# Patient Record
Sex: Female | Born: 1990
Health system: Southern US, Community
[De-identification: ages and names within clinical notes are randomized; demographics above are authoritative.]

## PROBLEM LIST (undated history)

## (undated) DIAGNOSIS — R87629 Unspecified abnormal cytological findings in specimens from vagina: Secondary | ICD-10-CM

## (undated) HISTORY — DX: Unspecified abnormal cytological findings in specimens from vagina: R87.629

---

## 2012-04-29 ENCOUNTER — Encounter (HOSPITAL_BASED_OUTPATIENT_CLINIC_OR_DEPARTMENT_OTHER): Payer: Self-pay | Admitting: Family Medicine

## 2012-04-29 ENCOUNTER — Emergency Department (HOSPITAL_BASED_OUTPATIENT_CLINIC_OR_DEPARTMENT_OTHER)
Admission: EM | Admit: 2012-04-29 | Discharge: 2012-04-29 | Disposition: A | Discharge status: Home / Self Care | Payer: Self-pay | Attending: Emergency Medicine | Admitting: Emergency Medicine

## 2012-04-29 DIAGNOSIS — R51 Headache: Secondary | ICD-10-CM | POA: Insufficient documentation

## 2012-04-29 DIAGNOSIS — F17200 Nicotine dependence, unspecified, uncomplicated: Secondary | ICD-10-CM | POA: Insufficient documentation

## 2012-04-29 DIAGNOSIS — N390 Urinary tract infection, site not specified: Secondary | ICD-10-CM | POA: Insufficient documentation

## 2012-04-29 LAB — BASIC METABOLIC PANEL
Calcium: 9.5 mg/dL (ref 8.4–10.5)
GFR calc Af Amer: >90 mL/min (ref >90)
GFR calc non Af Amer: >90 mL/min (ref >90)
Glucose, Bld: 84 mg/dL (ref 70–99)
Sodium: 141 mEq/L (ref 135–145)

## 2012-04-29 LAB — CBC WITH DIFFERENTIAL/PLATELET
Basophils Absolute: 0 10*3/uL (ref 0.0–0.1)
Basophils Relative: 0 % (ref 0–1)
Eosinophils Absolute: 0.2 10*3/uL (ref 0.0–0.7)
Eosinophils Relative: 2 % (ref 0–5)
MCH: 26.3 pg (ref 26.0–34.0)
MCHC: 33.9 g/dL (ref 30.0–36.0)
MCV: 77.6 fL — ABNORMAL LOW (ref 78.0–100.0)
Neutrophils Relative %: 37 % — ABNORMAL LOW (ref 43–77)
Platelets: 204 10*3/uL (ref 150–400)
RDW: 14.8 % (ref 11.5–15.5)

## 2012-04-29 LAB — URINALYSIS, ROUTINE W REFLEX MICROSCOPIC
Nitrite: NEGATIVE
Protein, ur: NEGATIVE mg/dL
Urobilinogen, UA: 1 mg/dL (ref 0.0–1.0)

## 2012-04-29 LAB — PREGNANCY, URINE: Preg Test, Ur: NEGATIVE

## 2012-04-29 LAB — URINE MICROSCOPIC-ADD ON

## 2012-04-29 MED ORDER — KETOROLAC TROMETHAMINE 30 MG/ML IJ SOLN
30.0000 mg | Freq: Once | INTRAMUSCULAR | Status: AC
  Administered 2012-04-29: 30 mg via INTRAMUSCULAR
  Filled 2012-04-29: qty 1

## 2012-04-29 MED ORDER — CIPROFLOXACIN HCL 500 MG PO TABS: 500.0000 mg | ORAL_TABLET | Freq: Two times a day (BID) | ORAL | Status: DC

## 2012-04-29 NOTE — ED Provider Notes (Signed)
History  This chart was scribed for Rolan Bucco, MD by Ardeen Jourdain, ED Scribe. This patient was seen in room MH08/MH08 and the patient's care was started at 2042.  CSN: [id number]  Arrival date & time 04/29/12  1843   First MD Initiated Contact with Patient 04/29/12 2042      Chief Complaint  Patient presents with  . Headache     The history is provided by the patient. No language interpreter was used.    Maria Davis is a 21 y.o. female who presents to the Emergency Department complaining of a gradually worsening headache that started 1 day ago with associated fatigue. She states the pain is intermittent and located in the frontal region. She denies any history of similar symptoms. She also denies fever, nausea, emesis, weight loss, neck pain, numbness and urinary problems. She states she is under a great deal of stress which could be a cause. She rates the pain at a 7 out of 10. She does not have a h/o of any pertinent or chronic medical conditions. She is a current everyday smoker but denies alcohol use.  Denies any recent head trauma or other headaches.    History reviewed. No pertinent past medical history.  History reviewed. No pertinent past surgical history.  No family history on file.  History  Substance Use Topics  . Smoking status: Current Every Day Smoker  . Smokeless tobacco: Not on file  . Alcohol Use: No   No OB history available.   Review of Systems  Constitutional: Negative for fever, chills, diaphoresis and fatigue.  HENT: Negative for congestion, rhinorrhea and sneezing.   Eyes: Negative.   Respiratory: Negative for cough, chest tightness and shortness of breath.   Cardiovascular: Negative for chest pain and leg swelling.  Gastrointestinal: Negative for nausea, vomiting, abdominal pain, diarrhea and blood in stool.  Genitourinary: Negative for frequency, hematuria, flank pain and difficulty urinating.  Musculoskeletal: Negative for back pain and  arthralgias.  Skin: Negative for rash.  Neurological: Negative for dizziness, speech difficulty, weakness, numbness and headaches.  All other systems reviewed and are negative.    Allergies  Review of patient's allergies indicates no known allergies.  Home Medications   Current Outpatient Rx  Name  Route  Sig  Dispense  Refill  . CIPROFLOXACIN HCL 500 MG PO TABS   Oral   Take 1 tablet (500 mg total) by mouth every 12 (twelve) hours.   14 tablet   0     Triage Vitals: BP 117/62  Pulse 84  Temp 98.9 F (37.2 C) (Oral)  Resp 16  Ht 5\' 3"  (1.6 m)  Wt 142 lb 7 oz (64.609 kg)  BMI 25.23 kg/m2  SpO2 100%  LMP 04/16/2012  Physical Exam  Nursing note and vitals reviewed. Constitutional: She is oriented to person, place, and time. She appears well-developed and well-nourished.  HENT:  Head: Normocephalic and atraumatic.  Eyes: Pupils are equal, round, and reactive to light.  Neck: Normal range of motion. Neck supple.  Cardiovascular: Normal rate, regular rhythm and normal heart sounds.   Pulmonary/Chest: Effort normal and breath sounds normal. No respiratory distress. She has no wheezes. She has no rales. She exhibits no tenderness.  Abdominal: Soft. Bowel sounds are normal. There is no tenderness. There is no rebound and no guarding.  Musculoskeletal: Normal range of motion. She exhibits no edema.  Lymphadenopathy:    She has no cervical adenopathy.  Neurological: She is alert and oriented to person, place,  and time. She displays normal reflexes. No cranial nerve deficit. She exhibits normal muscle tone. Coordination normal.       No photophobia, no meningeal signs, finger-nose intact  Skin: Skin is warm and dry. No rash noted.  Psychiatric: She has a normal mood and affect.    ED Course  Procedures (including critical care time)  DIAGNOSTIC STUDIES: Oxygen Saturation is 100% on room air, normal by my interpretation.    COORDINATION OF CARE:  8:59 PM: Discussed  treatment plan which includes bloodwork and pain medication with pt at bedside and pt agreed to plan.   Results for orders placed during the hospital encounter of 04/29/12  CBC WITH DIFFERENTIAL      Component Value Range   WBC 6.6  4.0 - 10.5 K/uL   RBC 4.56  3.87 - 5.11 MIL/uL   Hemoglobin 12.0  12.0 - 15.0 g/dL   HCT 16.1 (*) 09.6 - 04.5 %   MCV 77.6 (*) 78.0 - 100.0 fL   MCH 26.3  26.0 - 34.0 pg   MCHC 33.9  30.0 - 36.0 g/dL   RDW 40.9  81.1 - 91.4 %   Platelets 204  150 - 400 K/uL   Neutrophils Relative 37 (*) 43 - 77 %   Neutro Abs 2.4  1.7 - 7.7 K/uL   Lymphocytes Relative 54 (*) 12 - 46 %   Lymphs Abs 3.5  0.7 - 4.0 K/uL   Monocytes Relative 7  3 - 12 %   Monocytes Absolute 0.5  0.1 - 1.0 K/uL   Eosinophils Relative 2  0 - 5 %   Eosinophils Absolute 0.2  0.0 - 0.7 K/uL   Basophils Relative 0  0 - 1 %   Basophils Absolute 0.0  0.0 - 0.1 K/uL  BASIC METABOLIC PANEL      Component Value Range   Sodium 141  135 - 145 mEq/L   Potassium 3.8  3.5 - 5.1 mEq/L   Chloride 106  96 - 112 mEq/L   CO2 27  19 - 32 mEq/L   Glucose, Bld 84  70 - 99 mg/dL   BUN 11  6 - 23 mg/dL   Creatinine, Ser 7.82  0.50 - 1.10 mg/dL   Calcium 9.5  8.4 - 95.6 mg/dL   GFR calc non Af Amer >90  >90 mL/min   GFR calc Af Amer >90  >90 mL/min  URINALYSIS, ROUTINE W REFLEX MICROSCOPIC      Component Value Range   Color, Urine YELLOW  YELLOW   APPearance CLOUDY (*) CLEAR   Specific Gravity, Urine 1.027  1.005 - 1.030   pH 6.0  5.0 - 8.0   Glucose, UA NEGATIVE  NEGATIVE mg/dL   Hgb urine dipstick TRACE (*) NEGATIVE   Bilirubin Urine NEGATIVE  NEGATIVE   Ketones, ur NEGATIVE  NEGATIVE mg/dL   Protein, ur NEGATIVE  NEGATIVE mg/dL   Urobilinogen, UA 1.0  0.0 - 1.0 mg/dL   Nitrite NEGATIVE  NEGATIVE   Leukocytes, UA MODERATE (*) NEGATIVE  PREGNANCY, URINE      Component Value Range   Preg Test, Ur NEGATIVE  NEGATIVE  URINE MICROSCOPIC-ADD ON      Component Value Range   Squamous Epithelial / LPF  RARE  RARE   WBC, UA 7-10  <3 WBC/hpf   RBC / HPF 0-2  <3 RBC/hpf   Bacteria, UA MANY (*) RARE   Urine-Other MUCOUS PRESENT     No results found.  1. Headache   2. UTI (lower urinary tract infection)       MDM  Pt well appearing, smiling, texting on her phone on exam.  Intermittent headache since yesterday.  Nothing to suggest meningitis, SAH, mass.  Could be related to UTI.  Will tx with abx, advised to f/u with PMD or return her if headache worsens or is persistent      I personally performed the services described in this documentation, which was scribed in my presence.  The recorded information has been reviewed and considered.    Rolan Bucco, MD 04/30/12 0005

## 2012-04-29 NOTE — ED Notes (Signed)
Pt c/o headache x 1 day and feeling tired.

## 2012-05-03 LAB — URINE CULTURE

## 2012-05-04 NOTE — ED Notes (Signed)
+  Urine. Patient treated with Cipro. Sensitive to same. Per protocol MD. °

## 2013-06-04 ENCOUNTER — Emergency Department (HOSPITAL_BASED_OUTPATIENT_CLINIC_OR_DEPARTMENT_OTHER)
Admission: EM | Admit: 2013-06-04 | Discharge: 2013-06-04 | Disposition: A | Discharge status: Home / Self Care | Payer: Medicaid Other | Attending: Emergency Medicine | Admitting: Emergency Medicine

## 2013-06-04 ENCOUNTER — Encounter (HOSPITAL_BASED_OUTPATIENT_CLINIC_OR_DEPARTMENT_OTHER): Payer: Self-pay | Admitting: Emergency Medicine

## 2013-06-04 DIAGNOSIS — T7840XA Allergy, unspecified, initial encounter: Secondary | ICD-10-CM

## 2013-06-04 DIAGNOSIS — R1310 Dysphagia, unspecified: Secondary | ICD-10-CM | POA: Insufficient documentation

## 2013-06-04 DIAGNOSIS — Y929 Unspecified place or not applicable: Secondary | ICD-10-CM | POA: Insufficient documentation

## 2013-06-04 DIAGNOSIS — T628X1A Toxic effect of other specified noxious substances eaten as food, accidental (unintentional), initial encounter: Secondary | ICD-10-CM | POA: Insufficient documentation

## 2013-06-04 DIAGNOSIS — Y9389 Activity, other specified: Secondary | ICD-10-CM | POA: Insufficient documentation

## 2013-06-04 DIAGNOSIS — F17200 Nicotine dependence, unspecified, uncomplicated: Secondary | ICD-10-CM | POA: Insufficient documentation

## 2013-06-04 MED ORDER — FAMOTIDINE 20 MG PO TABS
20.0000 mg | ORAL_TABLET | Freq: Once | ORAL | Status: AC
  Administered 2013-06-04: 20 mg via ORAL
  Filled 2013-06-04: qty 1

## 2013-06-04 MED ORDER — EPINEPHRINE 0.3 MG/0.3ML IJ SOAJ: 0.3000 mg | INTRAMUSCULAR | Status: DC | PRN

## 2013-06-04 MED ORDER — DIPHENHYDRAMINE HCL 25 MG PO CAPS
25.0000 mg | ORAL_CAPSULE | Freq: Once | ORAL | Status: AC
  Administered 2013-06-04: 25 mg via ORAL
  Filled 2013-06-04: qty 1

## 2013-06-04 MED ORDER — FAMOTIDINE 20 MG PO TABS: 20.0000 mg | ORAL_TABLET | Freq: Two times a day (BID) | ORAL | Status: DC

## 2013-06-04 MED ORDER — DIPHENHYDRAMINE HCL 25 MG PO CAPS: 25.0000 mg | ORAL_CAPSULE | Freq: Four times a day (QID) | ORAL | Status: DC | PRN

## 2013-06-04 NOTE — ED Notes (Signed)
Pt placed on continuous pulse ox, PO 100% on room air. No resp diff noted, no vomiting at this time.

## 2013-06-04 NOTE — ED Provider Notes (Signed)
CSN: [id number]     Arrival date & time 06/04/13  1947 History   First MD Initiated Contact with Patient 06/04/13 2013     Chief Complaint  Patient presents with  . Allergic Reaction   (Consider location/radiation/quality/duration/timing/severity/associated sxs/prior Treatment) HPI Comments: She took one bite of a hamburger with tomato approximately one hour prior to arrival and had immediate symptoms of throat fullness and difficulty swallowing. No lip or tongue swelling. No rash or shortness of breath. No pain. She reports similar symptoms as a child. Currently, symptoms have improved with only mild residual dysphagia.  Patient is a 22 y.o. female presenting with allergic reaction. The history is provided by the patient. No language interpreter was used.  Allergic Reaction Presenting symptoms: difficulty swallowing   Presenting symptoms: no difficulty breathing and no rash     History reviewed. No pertinent past medical history. History reviewed. No pertinent past surgical history. No family history on file. History  Substance Use Topics  . Smoking status: Current Every Day Smoker    Types: Cigarettes  . Smokeless tobacco: Never Used  . Alcohol Use: Yes     Comment: occasional   OB History   Grav Para Term Preterm Abortions TAB SAB Ect Mult Living                 Review of Systems  Constitutional: Negative for fever.  HENT: Positive for trouble swallowing. Negative for facial swelling and sore throat.   Respiratory: Negative for shortness of breath.   Cardiovascular: Negative for chest pain.  Gastrointestinal: Negative for abdominal pain.  Skin: Negative for rash.    Allergies  Tomato  Home Medications   Current Outpatient Rx  Name  Route  Sig  Dispense  Refill  . ciprofloxacin (CIPRO) 500 MG tablet   Oral   Take 1 tablet (500 mg total) by mouth every 12 (twelve) hours.   14 tablet   0    BP 129/72  Pulse 86  Temp(Src) 98.1 F (36.7 C) (Oral)  Resp 20   Ht 5\' 3"  (1.6 m)  Wt 142 lb (64.411 kg)  BMI 25.16 kg/m2  SpO2 100% Physical Exam  Constitutional: She is oriented to person, place, and time. She appears well-developed and well-nourished.  HENT:  Head: Normocephalic.  Mouth/Throat: Oropharynx is clear and moist.  Neck: Normal range of motion. Neck supple.  Cardiovascular: Normal rate and regular rhythm.   No murmur heard. Pulmonary/Chest: Effort normal and breath sounds normal. No stridor. She has no wheezes. She has no rales.  Abdominal: Soft. Bowel sounds are normal. There is no tenderness. There is no rebound and no guarding.  Musculoskeletal: Normal range of motion.  Lymphadenopathy:    She has no cervical adenopathy.  Neurological: She is alert and oriented to person, place, and time.  Skin: Skin is warm and dry. No rash noted.  Psychiatric: She has a normal mood and affect.    ED Course  Procedures (including critical care time) Labs Review Labs Reviewed - No data to display Imaging Review No results found.  EKG Interpretation   None       MDM  No diagnosis found. 1. Acute allergic reaction.  Reaction mild and exam normal in ED. Treated with Benadryl and Pepcid without further development of symptoms. VSS. Stable for discharge.     Arnoldo Hooker, PA-C 06/04/13 2129

## 2013-06-04 NOTE — ED Provider Notes (Signed)
Medical screening examination/treatment/procedure(s) were performed by non-physician practitioner and as supervising physician I was immediately available for consultation/collaboration.     Caitland Porchia, MD 06/04/13 2313 

## 2013-06-04 NOTE — ED Notes (Signed)
Pt reports she ate fast food about 30 mins pta- asked for no tomatoes because she states she has had trouble eating them in the past. Ate one bite of sandwich which had tomato on it. States her throat started itching immediately, c/o abd pain and vomited x 1 in triage

## 2014-05-04 ENCOUNTER — Encounter (HOSPITAL_BASED_OUTPATIENT_CLINIC_OR_DEPARTMENT_OTHER): Payer: Self-pay | Admitting: *Deleted

## 2014-05-04 ENCOUNTER — Emergency Department (HOSPITAL_BASED_OUTPATIENT_CLINIC_OR_DEPARTMENT_OTHER)
Admission: EM | Admit: 2014-05-04 | Discharge: 2014-05-04 | Disposition: A | Discharge status: Home / Self Care | Payer: Medicaid Other | Attending: Emergency Medicine | Admitting: Emergency Medicine

## 2014-05-04 DIAGNOSIS — Z79899 Other long term (current) drug therapy: Secondary | ICD-10-CM | POA: Insufficient documentation

## 2014-05-04 DIAGNOSIS — Z720 Tobacco use: Secondary | ICD-10-CM | POA: Insufficient documentation

## 2014-05-04 DIAGNOSIS — Z792 Long term (current) use of antibiotics: Secondary | ICD-10-CM | POA: Insufficient documentation

## 2014-05-04 DIAGNOSIS — K047 Periapical abscess without sinus: Secondary | ICD-10-CM | POA: Insufficient documentation

## 2014-05-04 MED ORDER — PENICILLIN V POTASSIUM 500 MG PO TABS: 500.0000 mg | ORAL_TABLET | Freq: Four times a day (QID) | ORAL | Status: AC

## 2014-05-04 NOTE — ED Provider Notes (Signed)
CSN: [id number]     Arrival date & time 05/04/14  1539 History  This chart was scribed for Toy CookeyMegan Docherty, MD by Charline BillsEssence Howell, ED Scribe. The patient was seen in room MH05/MH05. Patient's care was started at 3:49 PM.   Chief Complaint  Patient presents with  . Dental Pain   Patient is a 23 y.o. female presenting with tooth pain. The history is provided by the patient. No language interpreter was used.  Dental Pain Location:  Upper Quality:  Pressure-like Severity:  Moderate Onset quality:  Sudden Duration:  8 hours Progression:  Improving Chronicity:  New Relieved by:  NSAIDs Associated symptoms: facial swelling   Associated symptoms: no congestion, no fever, no headaches and no neck pain    HPI Comments: Maria Davis is a 23 y.o. female who presents to the Emergency Department complaining of gradually improving L sided facial swelling onset this morning upon waking. Pt reports associated L sided upper dental pain that she describes as pressure. Pain is exacerbated with chewing. She denies fever, chills, ear pain, sore throat, difficulty swallowing, difficulty opening her mouth. Pt tried ibuprofen this morning with mild relief. No h/o similar infection. No allergies. Pt has a Education officer, communitydentist.  History reviewed. No pertinent past medical history. History reviewed. No pertinent past surgical history. History reviewed. No pertinent family history. History  Substance Use Topics  . Smoking status: Current Every Day Smoker -- 0.50 packs/day    Types: Cigarettes  . Smokeless tobacco: Never Used  . Alcohol Use: Yes     Comment: occasional   OB History    No data available     Review of Systems  Constitutional: Negative for fever, chills, diaphoresis, activity change, appetite change and fatigue.  HENT: Positive for dental problem and facial swelling. Negative for congestion, ear pain, rhinorrhea, sore throat and trouble swallowing.   Eyes: Negative for photophobia and discharge.   Respiratory: Negative for cough, chest tightness and shortness of breath.   Cardiovascular: Negative for chest pain, palpitations and leg swelling.  Gastrointestinal: Negative for nausea, vomiting, abdominal pain and diarrhea.  Endocrine: Negative for polydipsia and polyuria.  Genitourinary: Negative for dysuria, frequency, difficulty urinating and pelvic pain.  Musculoskeletal: Negative for back pain, arthralgias, neck pain and neck stiffness.  Skin: Negative for color change and wound.  Allergic/Immunologic: Negative for immunocompromised state.  Neurological: Negative for facial asymmetry, weakness, numbness and headaches.  Hematological: Does not bruise/bleed easily.  Psychiatric/Behavioral: Negative for confusion and agitation.    Allergies  Tomato  Home Medications   Prior to Admission medications   Medication Sig Start Date End Date Taking? Authorizing Provider  ciprofloxacin (CIPRO) 500 MG tablet Take 1 tablet (500 mg total) by mouth every 12 (twelve) hours. 04/29/12   Rolan BuccoMelanie Belfi, MD  diphenhydrAMINE (BENADRYL) 25 mg capsule Take 1 capsule (25 mg total) by mouth every 6 (six) hours as needed. 06/04/13   Shari A Upstill, PA-C  EPINEPHrine (EPIPEN) 0.3 mg/0.3 mL SOAJ injection Inject 0.3 mLs (0.3 mg total) into the muscle as needed. Keep one in your purse, one in your home and one in your car and use in the event you suspect an allergic reaction. Then go to your nearest emergency room for further treatment. 06/04/13   Shari A Upstill, PA-C  famotidine (PEPCID) 20 MG tablet Take 1 tablet (20 mg total) by mouth 2 (two) times daily. 06/04/13   Shari A Upstill, PA-C  penicillin v potassium (VEETID) 500 MG tablet Take 1 tablet (500 mg total)  by mouth 4 (four) times daily. 05/04/14 05/11/14  Toy CookeyMegan Docherty, MD   Triage Vitals: BP 111/53 mmHg  Pulse 69  Temp(Src) 98.2 F (36.8 C) (Oral)  Resp 16  Ht 5\' 3"  (1.6 m)  Wt 152 lb (68.947 kg)  BMI 26.93 kg/m2  SpO2 100% Physical Exam   Constitutional: She is oriented to person, place, and time. She appears well-developed and well-nourished. No distress.  HENT:  Head: Normocephalic.  Mouth/Throat: Oropharynx is clear and moist.  Soft tissue swelling of L cheek and buccal mucosa around L upper posterior molars  No visible abscess  Eyes: Pupils are equal, round, and reactive to light.  Neck: Neck supple.  Cardiovascular: Normal rate, regular rhythm and normal heart sounds.   Pulmonary/Chest: Effort normal and breath sounds normal. No respiratory distress. She has no wheezes.  Abdominal: Soft. She exhibits no distension. There is no tenderness. There is no rebound and no guarding.  Musculoskeletal: She exhibits no edema or tenderness.  Neurological: She is alert and oriented to person, place, and time.  Skin: Skin is warm and dry.  Psychiatric: She has a normal mood and affect.  Nursing note and vitals reviewed.  ED Course  Procedures (including critical care time) DIAGNOSTIC STUDIES: Oxygen Saturation is 100% on RA, normal by my interpretation.    COORDINATION OF CARE: 3:54 PM-Discussed treatment plan which includes antibiotics and follow-up with dentist with pt at bedside and pt agreed to plan.   Labs Review Labs Reviewed - No data to display  Imaging Review No results found.   EKG Interpretation None      MDM   Final diagnoses:  Periapical abscess with facial involvement    Pt is a 23 y.o. female with Pmhx as above who presents with 1 days of facial swelling and mild dental pain of upper L posterior molar. She has soft tissue swelling as above w/o evidence of abscess, trismus or airway compromise. Suspect early periapical abscess w/ facial cellulitis. Will start on 10d pen VK, outpt dentistry follow up. Return precautions given for new or worsening symptoms including worsening swelling, fever, trouble swallowing.     I personally performed the services described in this documentation, which was  scribed in my presence. The recorded information has been reviewed and is accurate.    Toy CookeyMegan Docherty, MD 05/04/14 [phone]

## 2014-05-04 NOTE — ED Notes (Signed)
Pt c/o dental pain x 8 hrs

## 2014-05-04 NOTE — Discharge Instructions (Signed)

## 2014-06-10 ENCOUNTER — Encounter (HOSPITAL_BASED_OUTPATIENT_CLINIC_OR_DEPARTMENT_OTHER): Payer: Self-pay | Admitting: Emergency Medicine

## 2014-06-10 ENCOUNTER — Emergency Department (HOSPITAL_BASED_OUTPATIENT_CLINIC_OR_DEPARTMENT_OTHER)
Admission: EM | Admit: 2014-06-10 | Discharge: 2014-06-10 | Disposition: A | Discharge status: Home / Self Care | Payer: Medicaid Other | Attending: Emergency Medicine | Admitting: Emergency Medicine

## 2014-06-10 ENCOUNTER — Emergency Department (HOSPITAL_BASED_OUTPATIENT_CLINIC_OR_DEPARTMENT_OTHER): Payer: Medicaid Other

## 2014-06-10 DIAGNOSIS — Y990 Civilian activity done for income or pay: Secondary | ICD-10-CM | POA: Insufficient documentation

## 2014-06-10 DIAGNOSIS — S6392XA Sprain of unspecified part of left wrist and hand, initial encounter: Secondary | ICD-10-CM | POA: Insufficient documentation

## 2014-06-10 DIAGNOSIS — S63502A Unspecified sprain of left wrist, initial encounter: Secondary | ICD-10-CM

## 2014-06-10 DIAGNOSIS — R52 Pain, unspecified: Secondary | ICD-10-CM

## 2014-06-10 DIAGNOSIS — Z792 Long term (current) use of antibiotics: Secondary | ICD-10-CM | POA: Insufficient documentation

## 2014-06-10 DIAGNOSIS — Z720 Tobacco use: Secondary | ICD-10-CM | POA: Insufficient documentation

## 2014-06-10 DIAGNOSIS — Z79899 Other long term (current) drug therapy: Secondary | ICD-10-CM | POA: Insufficient documentation

## 2014-06-10 DIAGNOSIS — Y9289 Other specified places as the place of occurrence of the external cause: Secondary | ICD-10-CM | POA: Insufficient documentation

## 2014-06-10 DIAGNOSIS — X58XXXA Exposure to other specified factors, initial encounter: Secondary | ICD-10-CM | POA: Insufficient documentation

## 2014-06-10 DIAGNOSIS — Y9389 Activity, other specified: Secondary | ICD-10-CM | POA: Insufficient documentation

## 2014-06-10 NOTE — ED Provider Notes (Signed)
CSN: [id number]     Arrival date & time 06/10/14  0703 History   First MD Initiated Contact with Patient 06/10/14 0755     Chief Complaint  Patient presents with  . Arm Pain     (Consider location/radiation/quality/duration/timing/severity/associated sxs/prior Treatment) HPI Comments: Patient reports moving multiple boxes last night at work, and beginning to have a throbbing left wrist pain starting last night that persisted this morning after taking ibuprofen.  No numbness or weakness.  No fevers or chills  Patient is a 23 y.o. female presenting with arm pain. The history is provided by the patient.  Arm Pain This is a new problem. The current episode started yesterday. The problem occurs constantly. Pertinent negatives include no chest pain, no abdominal pain, no headaches and no shortness of breath. Exacerbated by: bending wrist. The symptoms are relieved by rest. She has tried nothing for the symptoms. The treatment provided no relief.    History reviewed. No pertinent past medical history. History reviewed. No pertinent past surgical history. History reviewed. No pertinent family history. History  Substance Use Topics  . Smoking status: Current Every Day Smoker -- 0.50 packs/day    Types: Cigarettes  . Smokeless tobacco: Never Used  . Alcohol Use: Yes     Comment: occasional   OB History    No data available     Review of Systems  Constitutional: Negative for fever, chills, diaphoresis, activity change, appetite change and fatigue.  HENT: Negative for congestion, facial swelling, rhinorrhea and sore throat.   Eyes: Negative for photophobia and discharge.  Respiratory: Negative for cough, chest tightness and shortness of breath.   Cardiovascular: Negative for chest pain, palpitations and leg swelling.  Gastrointestinal: Negative for nausea, vomiting, abdominal pain and diarrhea.  Endocrine: Negative for polydipsia and polyuria.  Genitourinary: Negative for dysuria,  frequency, difficulty urinating and pelvic pain.  Musculoskeletal: Negative for back pain, arthralgias, neck pain and neck stiffness.  Skin: Negative for color change and wound.  Allergic/Immunologic: Negative for immunocompromised state.  Neurological: Negative for facial asymmetry, weakness, numbness and headaches.  Hematological: Does not bruise/bleed easily.  Psychiatric/Behavioral: Negative for confusion and agitation.      Allergies  Tomato  Home Medications   Prior to Admission medications   Medication Sig Start Date End Date Taking? Authorizing Provider  ciprofloxacin (CIPRO) 500 MG tablet Take 1 tablet (500 mg total) by mouth every 12 (twelve) hours. 04/29/12   Rolan BuccoMelanie Belfi, MD  diphenhydrAMINE (BENADRYL) 25 mg capsule Take 1 capsule (25 mg total) by mouth every 6 (six) hours as needed. 06/04/13   Shari A Upstill, PA-C  EPINEPHrine (EPIPEN) 0.3 mg/0.3 mL SOAJ injection Inject 0.3 mLs (0.3 mg total) into the muscle as needed. Keep one in your purse, one in your home and one in your car and use in the event you suspect an allergic reaction. Then go to your nearest emergency room for further treatment. 06/04/13   Shari A Upstill, PA-C  famotidine (PEPCID) 20 MG tablet Take 1 tablet (20 mg total) by mouth 2 (two) times daily. 06/04/13   Shari A Upstill, PA-C   BP 104/55 mmHg  Pulse 86  Temp(Src) 98.4 F (36.9 C) (Oral)  Resp 16  Wt 150 lb (68.04 kg)  SpO2 100%  LMP 06/03/2014 Physical Exam  Constitutional: She is oriented to person, place, and time. She appears well-developed and well-nourished. No distress.  HENT:  Head: Normocephalic and atraumatic.  Mouth/Throat: No oropharyngeal exudate.  Eyes: Pupils are equal, round,  and reactive to light.  Neck: Normal range of motion. Neck supple.  Cardiovascular: Normal rate, regular rhythm and normal heart sounds.  Exam reveals no gallop and no friction rub.   No murmur heard. Pulmonary/Chest: Effort normal and breath sounds  normal. No respiratory distress. She has no wheezes. She has no rales.  Abdominal: Soft. Bowel sounds are normal. She exhibits no distension and no mass. There is no tenderness. There is no rebound and no guarding.  Musculoskeletal: Normal range of motion. She exhibits no edema.       Left wrist: She exhibits tenderness. She exhibits normal range of motion, no swelling, no effusion, no deformity and no laceration.       Arms: Neurological: She is alert and oriented to person, place, and time.  Skin: Skin is warm and dry.  Psychiatric: She has a normal mood and affect.    ED Course  Procedures (including critical care time) Labs Review Labs Reviewed - No data to display  Imaging Review Dg Wrist Complete Left  06/10/2014   CLINICAL DATA:  One day history of generalized wrist pain. No well-defined trauma history.  EXAM: LEFT WRIST - COMPLETE 3+ VIEW  COMPARISON:  None.  FINDINGS: Frontal, oblique, lateral, and ulnar deviation scaphoid images were obtained. There is no fracture or dislocation. Joint spaces appear intact. No erosive change.  IMPRESSION: No fracture or dislocation.  No appreciable arthropathic change.   Electronically Signed   By: Bretta BangWilliam  Woodruff M.D.   On: 06/10/2014 08:05     EKG Interpretation None      MDM   Final diagnoses:  Pain  Left wrist sprain, initial encounter    Pt is a 23 y.o. female with Pmhx as above who presents with left wrist pain after moving boxes at work last night.  She is neurovascularly intact distally.  X-ray negative.  Suspect wrist sprain.  We'll place a removable Velcro splint for comfort.  Recommend NSAIDs at home for pain and swelling.     Cindie CrumblyJacques Maslin evaluation in the Emergency Department is complete. It has been determined that no acute conditions requiring further emergency intervention are present at this time. The patient/guardian have been advised of the diagnosis and plan. We have discussed signs and symptoms that warrant  return to the ED, such as changes or worsening in symptoms, fevers, chills, numbness, weakness      Toy CookeyMegan Docherty, MD 06/11/14 2000

## 2014-06-10 NOTE — Discharge Instructions (Signed)
Wrist Pain °A wrist sprain happens when the bands of tissue that hold the wrist joints together (ligament) stretch too much or tear. A wrist strain happens when muscles or bands of tissue that connect muscles to bones (tendons) are stretched or pulled. °HOME CARE °· Put ice on the injured area. °¨ Put ice in a plastic bag. °¨ Place a towel between your skin and the bag. °¨ Leave the ice on for 15-20 minutes, 03-04 times a day, for the first 2 days. °· Raise (elevate) the injured wrist to lessen puffiness (swelling). °· Rest the injured wrist for at least 48 hours or as told by your doctor. °· Wear a splint, cast, or an elastic wrap as told by your doctor. °· Only take medicine as told by your doctor. °· Follow up with your doctor as told. This is important. °GET HELP RIGHT AWAY IF:  °· The fingers are puffy, very red, white, or cold and blue. °· The fingers lose feeling (numb) or tingle. °· The pain gets worse. °· It is hard to move the fingers. °MAKE SURE YOU:  °· Understand these instructions. °· Will watch your condition. °· Will get help right away if you are not doing well or get worse. °Document Released: 11/22/2007 Document Revised: 08/28/2011 Document Reviewed: 07/27/2010 °Exit[employer]® Patient Information ©2015 [employer]. This information is not intended to replace advice given to you by your health care provider. Make sure you discuss any questions you have with your health care provider. ° °

## 2014-06-10 NOTE — ED Notes (Signed)
Patient reports that she has left arm pain from her wrist to her elbow.

## 2014-08-06 ENCOUNTER — Encounter (HOSPITAL_BASED_OUTPATIENT_CLINIC_OR_DEPARTMENT_OTHER): Payer: Self-pay

## 2014-08-06 ENCOUNTER — Other Ambulatory Visit: Payer: Self-pay | Admitting: Emergency Medicine

## 2014-08-06 ENCOUNTER — Emergency Department (HOSPITAL_BASED_OUTPATIENT_CLINIC_OR_DEPARTMENT_OTHER)
Admission: EM | Admit: 2014-08-06 | Discharge: 2014-08-06 | Disposition: A | Discharge status: Home / Self Care | Payer: Medicaid Other | Attending: Emergency Medicine | Admitting: Emergency Medicine

## 2014-08-06 DIAGNOSIS — M791 Myalgia: Secondary | ICD-10-CM | POA: Insufficient documentation

## 2014-08-06 DIAGNOSIS — B349 Viral infection, unspecified: Secondary | ICD-10-CM | POA: Insufficient documentation

## 2014-08-06 DIAGNOSIS — Z79899 Other long term (current) drug therapy: Secondary | ICD-10-CM | POA: Insufficient documentation

## 2014-08-06 DIAGNOSIS — Z720 Tobacco use: Secondary | ICD-10-CM | POA: Insufficient documentation

## 2014-08-06 DIAGNOSIS — Z3202 Encounter for pregnancy test, result negative: Secondary | ICD-10-CM | POA: Insufficient documentation

## 2014-08-06 LAB — URINE MICROSCOPIC-ADD ON

## 2014-08-06 LAB — URINALYSIS, ROUTINE W REFLEX MICROSCOPIC
BILIRUBIN URINE: NEGATIVE
Glucose, UA: NEGATIVE mg/dL
KETONES UR: NEGATIVE mg/dL
Nitrite: NEGATIVE
Protein, ur: NEGATIVE mg/dL
Specific Gravity, Urine: 1.025 (ref 1.005–1.030)
Urobilinogen, UA: 1 mg/dL (ref 0.0–1.0)
pH: 7 (ref 5.0–8.0)

## 2014-08-06 LAB — BASIC METABOLIC PANEL
Anion gap: 2 — ABNORMAL LOW (ref 5–15)
BUN: 9 mg/dL (ref 6–23)
CO2: 25 mmol/L (ref 19–32)
Calcium: 8.6 mg/dL (ref 8.4–10.5)
Chloride: 109 mmol/L (ref 96–112)
Creatinine, Ser: 0.49 mg/dL — ABNORMAL LOW (ref 0.50–1.10)
Glucose, Bld: 85 mg/dL (ref 70–99)
POTASSIUM: 3.7 mmol/L (ref 3.5–5.1)
SODIUM: 136 mmol/L (ref 135–145)

## 2014-08-06 LAB — RAPID STREP SCREEN (MED CTR MEBANE ONLY): STREPTOCOCCUS, GROUP A SCREEN (DIRECT): NEGATIVE

## 2014-08-06 LAB — PREGNANCY, URINE: Preg Test, Ur: NEGATIVE

## 2014-08-06 MED ORDER — ONDANSETRON HCL 4 MG/2ML IJ SOLN
4.0000 mg | Freq: Once | INTRAMUSCULAR | Status: AC
  Administered 2014-08-06: 4 mg via INTRAVENOUS
  Filled 2014-08-06: qty 2

## 2014-08-06 MED ORDER — ONDANSETRON 4 MG PO TBDP: ORAL_TABLET | ORAL | Status: DC

## 2014-08-06 MED ORDER — SODIUM CHLORIDE 0.9 % IV BOLUS (SEPSIS)
1000.0000 mL | Freq: Once | INTRAVENOUS | Status: AC
  Administered 2014-08-06: 1000 mL via INTRAVENOUS

## 2014-08-06 NOTE — Discharge Instructions (Signed)

## 2014-08-06 NOTE — ED Notes (Signed)
Vomiting, fever and unable to keep PO fluids down x 2 days

## 2014-08-06 NOTE — ED Notes (Signed)
C/o feeling sick since Tuesday- chills, sore throat, body aches- n/v/d also

## 2014-08-06 NOTE — ED Provider Notes (Signed)
CSN: [id number]     Arrival date & time 08/06/14  4098 History   First MD Initiated Contact with Patient 08/06/14 1105     Chief Complaint  Patient presents with  . Emesis     (Consider location/radiation/quality/duration/timing/severity/associated sxs/prior Treatment) HPI Comments: Patient history of nausea vomiting diarrhea for 2 days. She's had some myalgias and sore throat as well. She denies any known fevers but has had some myalgias. She denies any runny nose or congestion. She hasn't had any vomiting since this morning. She denies any abdominal pain.she denies any urinary symptoms.  Patient is a 24 y.o. female presenting with vomiting.  Emesis Associated symptoms: diarrhea, myalgias and sore throat   Associated symptoms: no abdominal pain, no arthralgias, no chills and no headaches     History reviewed. No pertinent past medical history. History reviewed. No pertinent past surgical history. No family history on file. History  Substance Use Topics  . Smoking status: Current Every Day Smoker -- 0.50 packs/day    Types: Cigarettes  . Smokeless tobacco: Never Used  . Alcohol Use: Yes     Comment: occasional   OB History    No data available     Review of Systems  Constitutional: Positive for fatigue. Negative for fever, chills and diaphoresis.  HENT: Positive for sore throat. Negative for congestion, rhinorrhea and sneezing.   Eyes: Negative.   Respiratory: Negative for cough, chest tightness and shortness of breath.   Cardiovascular: Negative for chest pain and leg swelling.  Gastrointestinal: Positive for nausea, vomiting and diarrhea. Negative for abdominal pain and blood in stool.  Genitourinary: Negative for frequency, hematuria, flank pain and difficulty urinating.  Musculoskeletal: Positive for myalgias. Negative for back pain and arthralgias.  Skin: Negative for rash.  Neurological: Negative for dizziness, speech difficulty, weakness, numbness and headaches.       Allergies  Tomato  Home Medications   Prior to Admission medications   Medication Sig Start Date End Date Taking? Authorizing Provider  EPINEPHrine (EPIPEN) 0.3 mg/0.3 mL SOAJ injection Inject 0.3 mLs (0.3 mg total) into the muscle as needed. Keep one in your purse, one in your home and one in your car and use in the event you suspect an allergic reaction. Then go to your nearest emergency room for further treatment. 06/04/13   Shari A Upstill, PA-C  ondansetron (ZOFRAN ODT) 4 MG disintegrating tablet  ODT q4 hours prn nausea/vomit 08/06/14   Rolan Bucco, MD   BP 115/61 mmHg  Pulse 83  Temp(Src) 98.7 F (37.1 C) (Oral)  Resp 16  Ht  (1.6 m)  Wt 152 lb (68.947 kg)  BMI 26.93 kg/m2  SpO2 100% Physical Exam  Constitutional: She is oriented to person, place, and time. She appears well-developed and well-nourished.  HENT:  Head: Normocephalic and atraumatic.  Right Ear: External ear normal.  Left Ear: External ear normal.  Mild erythema with exudates the tonsils bilaterally. Uvula is midline. No trismus.  Eyes: Pupils are equal, round, and reactive to light.  Neck: Normal range of motion. Neck supple.  Cardiovascular: Normal rate, regular rhythm and normal heart sounds.   Pulmonary/Chest: Effort normal and breath sounds normal. No respiratory distress. She has no wheezes. She has no rales. She exhibits no tenderness.  Abdominal: Soft. Bowel sounds are normal. There is no tenderness. There is no rebound and no guarding.  Musculoskeletal: Normal range of motion. She exhibits no edema.  Lymphadenopathy:    She has no cervical adenopathy.  Neurological:  She is alert and oriented to person, place, and time.  Skin: Skin is warm and dry. No rash noted.  Psychiatric: She has a normal mood and affect.    ED Course  Procedures (including critical care time) Labs Review Results for orders placed or performed during the hospital encounter of 08/06/14  Rapid strep screen   Result Value Ref Range   Streptococcus, Group A Screen (Direct) NEGATIVE NEGATIVE  Urinalysis, Routine w reflex microscopic  Result Value Ref Range   Color, Urine YELLOW YELLOW   APPearance CLEAR CLEAR   Specific Gravity, Urine 1.025 1.005 - 1.030   pH 7.0 5.0 - 8.0   Glucose, UA NEGATIVE NEGATIVE mg/dL   Hgb urine dipstick TRACE (A) NEGATIVE   Bilirubin Urine NEGATIVE NEGATIVE   Ketones, ur NEGATIVE NEGATIVE mg/dL   Protein, ur NEGATIVE NEGATIVE mg/dL   Urobilinogen, UA 1.0 0.0 - 1.0 mg/dL   Nitrite NEGATIVE NEGATIVE   Leukocytes, UA SMALL (A) NEGATIVE  Pregnancy, urine  Result Value Ref Range   Preg Test, Ur NEGATIVE NEGATIVE  Basic metabolic panel  Result Value Ref Range   Sodium 136 135 - 145 mmol/L   Potassium 3.7 3.5 - 5.1 mmol/L   Chloride 109 96 - 112 mmol/L   CO2 25 19 - 32 mmol/L   Glucose, Bld 85 70 - 99 mg/dL   BUN 9 6 - 23 mg/dL   Creatinine, Ser 4.090.49 (L) 0.50 - 1.10 mg/dL   Calcium 8.6 8.4 - 81.110.5 mg/dL   GFR calc non Af Amer >90 >90 mL/min   GFR calc Af Amer >90 >90 mL/min   Anion gap 2 (L) 5 - 15  Urine microscopic-add on  Result Value Ref Range   Squamous Epithelial / LPF FEW (A) RARE   WBC, UA 0-2 <3 WBC/hpf   RBC / HPF 7-10 <3 RBC/hpf   Bacteria, UA FEW (A) RARE   Casts HYALINE CASTS (A) NEGATIVE   Urine-Other MUCOUS PRESENT    No results found.    Imaging Review No results found.   EKG Interpretation None      MDM   Final diagnoses:  Viral syndrome    Patient is feeling better after IV fluids and Zofran. She's had no ongoing vomiting. Her labs are unremarkable.  Her strept is neg.  Pt was discharged home in good condition.  Will d/c with rx for zofran, Return precautions given.  No abd pain on exam.    Rolan BuccoMelanie Merrily Tegeler, MD 08/06/14 1347

## 2014-08-14 ENCOUNTER — Encounter (HOSPITAL_BASED_OUTPATIENT_CLINIC_OR_DEPARTMENT_OTHER): Payer: Self-pay

## 2014-08-14 ENCOUNTER — Emergency Department (HOSPITAL_BASED_OUTPATIENT_CLINIC_OR_DEPARTMENT_OTHER)
Admission: EM | Admit: 2014-08-14 | Discharge: 2014-08-14 | Disposition: A | Discharge status: Home / Self Care | Payer: Medicaid Other | Attending: Emergency Medicine | Admitting: Emergency Medicine

## 2014-08-14 ENCOUNTER — Emergency Department (HOSPITAL_BASED_OUTPATIENT_CLINIC_OR_DEPARTMENT_OTHER): Payer: Medicaid Other

## 2014-08-14 DIAGNOSIS — Y998 Other external cause status: Secondary | ICD-10-CM | POA: Insufficient documentation

## 2014-08-14 DIAGNOSIS — Z720 Tobacco use: Secondary | ICD-10-CM | POA: Insufficient documentation

## 2014-08-14 DIAGNOSIS — Y92410 Unspecified street and highway as the place of occurrence of the external cause: Secondary | ICD-10-CM | POA: Insufficient documentation

## 2014-08-14 DIAGNOSIS — Y9389 Activity, other specified: Secondary | ICD-10-CM | POA: Insufficient documentation

## 2014-08-14 DIAGNOSIS — S638X2A Sprain of other part of left wrist and hand, initial encounter: Secondary | ICD-10-CM | POA: Insufficient documentation

## 2014-08-14 DIAGNOSIS — S63502A Unspecified sprain of left wrist, initial encounter: Secondary | ICD-10-CM

## 2014-08-14 MED ORDER — IBUPROFEN 400 MG PO TABS
600.0000 mg | ORAL_TABLET | Freq: Once | ORAL | Status: AC
  Administered 2014-08-14: 600 mg via ORAL
  Filled 2014-08-14 (×2): qty 1

## 2014-08-14 NOTE — ED Notes (Signed)
MD at bedside. 
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
## 2015-01-28 ENCOUNTER — Encounter (HOSPITAL_BASED_OUTPATIENT_CLINIC_OR_DEPARTMENT_OTHER): Payer: Self-pay | Admitting: *Deleted

## 2015-01-28 ENCOUNTER — Emergency Department (HOSPITAL_BASED_OUTPATIENT_CLINIC_OR_DEPARTMENT_OTHER)
Admission: EM | Admit: 2015-01-28 | Discharge: 2015-01-28 | Disposition: A | Discharge status: Home / Self Care | Payer: Medicaid Other | Attending: Emergency Medicine | Admitting: Emergency Medicine

## 2015-01-28 DIAGNOSIS — K529 Noninfective gastroenteritis and colitis, unspecified: Secondary | ICD-10-CM | POA: Insufficient documentation

## 2015-01-28 DIAGNOSIS — Z720 Tobacco use: Secondary | ICD-10-CM | POA: Insufficient documentation

## 2015-01-28 DIAGNOSIS — Z3202 Encounter for pregnancy test, result negative: Secondary | ICD-10-CM | POA: Insufficient documentation

## 2015-01-28 LAB — URINALYSIS, ROUTINE W REFLEX MICROSCOPIC
BILIRUBIN URINE: NEGATIVE
Glucose, UA: NEGATIVE mg/dL
Ketones, ur: NEGATIVE mg/dL
LEUKOCYTES UA: NEGATIVE
NITRITE: NEGATIVE
PH: 6 (ref 5.0–8.0)
Protein, ur: NEGATIVE mg/dL
SPECIFIC GRAVITY, URINE: 1.02 (ref 1.005–1.030)
Urobilinogen, UA: 0.2 mg/dL (ref 0.0–1.0)

## 2015-01-28 LAB — URINE MICROSCOPIC-ADD ON

## 2015-01-28 LAB — PREGNANCY, URINE: Preg Test, Ur: NEGATIVE

## 2015-01-28 NOTE — Discharge Instructions (Signed)

## 2015-01-28 NOTE — ED Notes (Signed)
Nausea. Vomiting. No pain.

## 2015-01-28 NOTE — ED Provider Notes (Signed)
CSN: [id number]     Arrival date & time 01/28/15  1547 History   First MD Initiated Contact with Patient 01/28/15 1728     Chief Complaint  Patient presents with  . Emesis     (Consider location/radiation/quality/duration/timing/severity/associated sxs/prior Treatment) HPI Patient started having vomiting and diarrheal illness 2 days ago. She reports that she ate out on Sunday and felt fine, Monday she started to feel a little unwell and then Tuesday she was having vomiting and diarrhea. She reports approximately 4 episodes of vomiting and 4 episodes of diarrhea on the first day. Wednesday she continued to have several more episodes of vomiting and diarrhea. She reports her boss recommended she come get checked out because she works around [employer]. She reports as of today she is feeling improved. She reports she had one episode of vomiting today but was able to eat and keep down fluids. She reports no diarrheal illness today. No associated fever or abdominal pain. She reports she thought she had stomach flu. History reviewed. No pertinent past medical history. History reviewed. No pertinent past surgical history. No family history on file. Social History  Substance Use Topics  . Smoking status: Current Every Day Smoker -- 0.50 packs/day    Types: Cigarettes  . Smokeless tobacco: Never Used  . Alcohol Use: Yes     Comment: occasional   OB History    No data available     Review of Systems  10 Systems reviewed and are negative for acute change except as noted in the HPI.   Allergies  Tomato  Home Medications   Prior to Admission medications   Medication Sig Start Date End Date Taking? Authorizing Provider  EPINEPHrine (EPIPEN) 0.3 mg/0.3 mL SOAJ injection Inject 0.3 mLs (0.3 mg total) into the muscle as needed. Keep one in your purse, one in your home and one in your car and use in the event you suspect an allergic reaction. Then go to your nearest emergency room for  further treatment. 06/04/13   Elpidio Anis, PA-C  ondansetron (ZOFRAN ODT) 4 MG disintegrating tablet  ODT q4 hours prn nausea/vomit 08/06/14   Rolan Bucco, MD   BP 113/66 mmHg  Pulse 80  Temp(Src) 98.4 F (36.9 C) (Oral)  Resp 16  Ht  (1.6 m)  Wt 153 lb (69.4 kg)  BMI 27.11 kg/m2  SpO2 100% Physical Exam  Constitutional: She is oriented to person, place, and time. She appears well-developed and well-nourished.  HENT:  Head: Normocephalic and atraumatic.  Eyes: EOM are normal. Pupils are equal, round, and reactive to light.  Neck: Neck supple.  Cardiovascular: Normal rate, regular rhythm, normal heart sounds and intact distal pulses.   Pulmonary/Chest: Effort normal and breath sounds normal.  Abdominal: Soft. Bowel sounds are normal. She exhibits no distension. There is no tenderness.  Musculoskeletal: Normal range of motion. She exhibits no edema.  Neurological: She is alert and oriented to person, place, and time. She has normal strength. Coordination normal. GCS eye subscore is 4. GCS verbal subscore is 5. GCS motor subscore is 6.  Skin: Skin is warm, dry and intact.  Psychiatric: She has a normal mood and affect.    ED Course  Procedures (including critical care time) Labs Review Labs Reviewed  URINALYSIS, ROUTINE W REFLEX MICROSCOPIC (NOT AT [facility]) - Abnormal; Notable for the following:    Hgb urine dipstick TRACE (*)    All other components within normal limits  PREGNANCY, URINE  URINE MICROSCOPIC-ADD ON  Imaging Review No results found. I, Arby Barrette, personally reviewed and evaluated these images and lab results as part of my medical decision-making.   EKG Interpretation None      MDM   Final diagnoses:  Gastroenteritis   Patient presents with uncomplicated vomiting and diarrhea illness. She has a soft and nontender abdomen and is tolerating oral intake. At this point safe for discharge.    Arby Barrette, MD 01/28/15 1739

## 2015-05-19 ENCOUNTER — Emergency Department (HOSPITAL_BASED_OUTPATIENT_CLINIC_OR_DEPARTMENT_OTHER)
Admission: EM | Admit: 2015-05-19 | Discharge: 2015-05-19 | Disposition: A | Discharge status: Home / Self Care | Payer: No Typology Code available for payment source | Attending: Emergency Medicine | Admitting: Emergency Medicine

## 2015-05-19 ENCOUNTER — Encounter (HOSPITAL_BASED_OUTPATIENT_CLINIC_OR_DEPARTMENT_OTHER): Payer: Self-pay | Admitting: Emergency Medicine

## 2015-05-19 DIAGNOSIS — S39012A Strain of muscle, fascia and tendon of lower back, initial encounter: Secondary | ICD-10-CM | POA: Diagnosis not present

## 2015-05-19 DIAGNOSIS — Y998 Other external cause status: Secondary | ICD-10-CM | POA: Diagnosis not present

## 2015-05-19 DIAGNOSIS — S3992XA Unspecified injury of lower back, initial encounter: Secondary | ICD-10-CM | POA: Diagnosis present

## 2015-05-19 DIAGNOSIS — Y92410 Unspecified street and highway as the place of occurrence of the external cause: Secondary | ICD-10-CM | POA: Insufficient documentation

## 2015-05-19 DIAGNOSIS — F17210 Nicotine dependence, cigarettes, uncomplicated: Secondary | ICD-10-CM | POA: Diagnosis not present

## 2015-05-19 DIAGNOSIS — Y9389 Activity, other specified: Secondary | ICD-10-CM | POA: Insufficient documentation

## 2015-05-19 MED ORDER — IBUPROFEN 600 MG PO TABS: 600.0000 mg | ORAL_TABLET | Freq: Four times a day (QID) | ORAL | Status: DC | PRN
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
## 2015-05-19 NOTE — Discharge Instructions (Signed)

## 2015-05-24 ENCOUNTER — Emergency Department (HOSPITAL_BASED_OUTPATIENT_CLINIC_OR_DEPARTMENT_OTHER)
Admission: EM | Admit: 2015-05-24 | Discharge: 2015-05-24 | Disposition: A | Discharge status: Home / Self Care | Payer: No Typology Code available for payment source | Attending: Emergency Medicine | Admitting: Emergency Medicine

## 2015-05-24 ENCOUNTER — Encounter (HOSPITAL_BASED_OUTPATIENT_CLINIC_OR_DEPARTMENT_OTHER): Payer: Self-pay | Admitting: *Deleted

## 2015-05-24 DIAGNOSIS — F17210 Nicotine dependence, cigarettes, uncomplicated: Secondary | ICD-10-CM | POA: Insufficient documentation

## 2015-05-24 DIAGNOSIS — M549 Dorsalgia, unspecified: Secondary | ICD-10-CM | POA: Diagnosis present

## 2015-05-24 DIAGNOSIS — Z0289 Encounter for other administrative examinations: Secondary | ICD-10-CM | POA: Diagnosis not present
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
## 2015-05-25 ENCOUNTER — Emergency Department (HOSPITAL_BASED_OUTPATIENT_CLINIC_OR_DEPARTMENT_OTHER)
Admission: EM | Admit: 2015-05-25 | Discharge: 2015-05-25 | Disposition: A | Discharge status: Home / Self Care | Payer: No Typology Code available for payment source | Attending: Emergency Medicine | Admitting: Emergency Medicine

## 2015-05-25 ENCOUNTER — Encounter (HOSPITAL_BASED_OUTPATIENT_CLINIC_OR_DEPARTMENT_OTHER): Payer: Self-pay | Admitting: *Deleted

## 2015-05-25 DIAGNOSIS — S3992XD Unspecified injury of lower back, subsequent encounter: Secondary | ICD-10-CM | POA: Diagnosis present

## 2015-05-25 DIAGNOSIS — S3991XD Unspecified injury of abdomen, subsequent encounter: Secondary | ICD-10-CM | POA: Diagnosis not present

## 2015-05-25 MED ORDER — CYCLOBENZAPRINE HCL 10 MG PO TABS: 5.0000 mg | ORAL_TABLET | Freq: Three times a day (TID) | ORAL | Status: DC | PRN
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
## 2015-05-25 NOTE — ED Notes (Signed)
MD at bedside. 
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
## 2015-05-25 NOTE — Discharge Instructions (Signed)
Continue to take the ibuprofen as prescribed. Take muscle relaxant prescribed tonight as needed which will help with muscle spasm. See her primary care physician if not improved in 2 days
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
## 2017-06-19 HISTORY — PX: WISDOM TOOTH EXTRACTION: SHX21

## 2018-05-02 ENCOUNTER — Encounter (HOSPITAL_BASED_OUTPATIENT_CLINIC_OR_DEPARTMENT_OTHER): Payer: Self-pay | Admitting: *Deleted

## 2018-05-02 ENCOUNTER — Emergency Department (HOSPITAL_BASED_OUTPATIENT_CLINIC_OR_DEPARTMENT_OTHER)
Admission: EM | Admit: 2018-05-02 | Discharge: 2018-05-02 | Disposition: A | Discharge status: Home / Self Care | Payer: No Typology Code available for payment source | Attending: Emergency Medicine | Admitting: Emergency Medicine

## 2018-05-02 ENCOUNTER — Other Ambulatory Visit: Payer: Self-pay

## 2018-05-02 ENCOUNTER — Emergency Department (HOSPITAL_BASED_OUTPATIENT_CLINIC_OR_DEPARTMENT_OTHER): Payer: No Typology Code available for payment source

## 2018-05-02 DIAGNOSIS — F17210 Nicotine dependence, cigarettes, uncomplicated: Secondary | ICD-10-CM | POA: Diagnosis not present

## 2018-05-02 DIAGNOSIS — S46911A Strain of unspecified muscle, fascia and tendon at shoulder and upper arm level, right arm, initial encounter: Secondary | ICD-10-CM | POA: Insufficient documentation

## 2018-05-02 DIAGNOSIS — S4991XA Unspecified injury of right shoulder and upper arm, initial encounter: Secondary | ICD-10-CM | POA: Diagnosis present

## 2018-05-02 DIAGNOSIS — X500XXA Overexertion from strenuous movement or load, initial encounter: Secondary | ICD-10-CM | POA: Diagnosis not present

## 2018-05-02 DIAGNOSIS — Y999 Unspecified external cause status: Secondary | ICD-10-CM | POA: Diagnosis not present

## 2018-05-02 DIAGNOSIS — Y9389 Activity, other specified: Secondary | ICD-10-CM | POA: Diagnosis not present

## 2018-05-02 DIAGNOSIS — Y929 Unspecified place or not applicable: Secondary | ICD-10-CM | POA: Insufficient documentation

## 2018-05-02 MED ORDER — IBUPROFEN 600 MG PO TABS: 600.0000 mg | ORAL_TABLET | Freq: Three times a day (TID) | ORAL | 0 refills | Status: DC | PRN

## 2018-05-02 MED ORDER — CYCLOBENZAPRINE HCL 10 MG PO TABS: 5.0000 mg | ORAL_TABLET | Freq: Three times a day (TID) | ORAL | 0 refills | Status: DC | PRN

## 2018-05-02 NOTE — ED Triage Notes (Signed)
Lifting at work and had pain in her right shoulder. Limited ROM. Workmans Comp. UDS no required.

## 2018-05-02 NOTE — Discharge Instructions (Signed)
Please rest and use shoulder sling for the next several days Take Ibuprofen 600mg  as directed  Take Flexeril as needed for muscle pain/spasms  You can use a heating pad to help with muscle stiffness

## 2018-05-02 NOTE — ED Provider Notes (Signed)
[facility] EMERGENCY DEPARTMENT Provider Note   CSN: [id number] Arrival date & time: 05/02/18  1706     History   Chief Complaint Chief Complaint  Patient presents with  . Shoulder Injury    HPI Maria Davis is a 27 y.o. female who presents with right shoulder pain. No significant PMH. She states that she was at work (works as a Education administrator) and may have been over doing it because they were short and she was doing a lot of lifting, pushing, pulling, and she started to have anterior right shoulder pain. She is able to move it but it hurts and is sore. She thinks she may have strained a muscle. The pain is constant and non-radiating. She denies a fall or anything hitting her shoulder. No numbness, tingling, or weakness. She was sent here by her job to be checked out.  HPI  History reviewed. No pertinent past medical history.  There are no active problems to display for this patient.   History reviewed. No pertinent surgical history.   OB History   None      Home Medications    Prior to Admission medications   Medication Sig Start Date End Date Taking? Authorizing Provider  cyclobenzaprine (FLEXERIL) 10 MG tablet Take 0.5 tablets (5 mg total) by mouth 3 (three) times daily as needed for muscle spasms. 05/02/18   Bethel Born, PA-C  EPINEPHrine (EPIPEN) 0.3 mg/0.3 mL SOAJ injection Inject 0.3 mLs (0.3 mg total) into the muscle as needed. Keep one in your purse, one in your home and one in your car and use in the event you suspect an allergic reaction. Then go to your nearest emergency room for further treatment. 06/04/13   Elpidio Anis, PA-C  ibuprofen (ADVIL,MOTRIN) 600 MG tablet Take 1 tablet (600 mg total) by mouth every 8 (eight) hours as needed. 05/02/18   Bethel Born, PA-C    Family History No family history on file.  Social History Social History   Tobacco Use  . Smoking status: Current Every Day Smoker    Packs/day: 0.50    Types:  Cigarettes  . Smokeless tobacco: Never Used  Substance Use Topics  . Alcohol use: Yes    Comment: occasional  . Drug use: No     Allergies   Tomato   Review of Systems Review of Systems  Musculoskeletal: Positive for arthralgias and myalgias.  Neurological: Negative for weakness and numbness.     Physical Exam Updated Vital Signs BP 122/71 (BP Location: Left Arm)   Pulse 91   Temp 98.2 F (36.8 C) (Oral)   Resp 18   SpO2 100%   Physical Exam  Constitutional: She is oriented to person, place, and time. She appears well-developed and well-nourished. No distress.  HENT:  Head: Normocephalic and atraumatic.  Eyes: Pupils are equal, round, and reactive to light. Conjunctivae are normal. Right eye exhibits no discharge. Left eye exhibits no discharge. No scleral icterus.  Neck: Normal range of motion.  Cardiovascular: Normal rate.  Pulmonary/Chest: Effort normal. No respiratory distress.  Abdominal: She exhibits no distension.  Musculoskeletal:  Right shoulder: No obvious swelling, deformity, or warmth. Tenderness to palpation over biceps tendon. Decreased ROM due to pain. Able to lift her arm actively to 90 degrees and hold it. 5/5 grip strength. N/V intact.   Neurological: She is alert and oriented to person, place, and time.  Skin: Skin is warm and dry.  Psychiatric: She has a normal mood and affect. Her  behavior is normal.  Nursing note and vitals reviewed.    ED Treatments / Results  Labs (all labs ordered are listed, but only abnormal results are displayed) Labs Reviewed - No data to display  EKG None  Radiology No results found.  Procedures Procedures (including critical care time)  Medications Ordered in ED Medications - No data to display   Initial Impression / Assessment and Plan / ED Course  I have reviewed the triage vital signs and the nursing notes.  Pertinent labs & imaging results that were available during my care of the patient were  reviewed by me and considered in my medical decision making (see chart for details).  27 year old female presents with right shoulder pain consistent with muscle strain after overuse at her job. She did not have any trauma. Xray ordered in triage was cancelled and patient is comfortable with this. There is no abnormality to suggest fracture or dislocation. She is able to range her shoulder on her own but it is decreased due to pain. Will place in a sling and give rx for Ibuprofen and Flexeril. She was given a work note.  Final Clinical Impressions(s) / ED Diagnoses   Final diagnoses:  Strain of right shoulder, initial encounter    ED Discharge Orders         Ordered    ibuprofen (ADVIL,MOTRIN) 600 MG tablet  Every 8 hours PRN,   Status:  Discontinued     05/02/18 1805    cyclobenzaprine (FLEXERIL) 10 MG tablet  3 times daily PRN     05/02/18 1805    ibuprofen (ADVIL,MOTRIN) 600 MG tablet  Every 8 hours PRN     05/02/18 1809           Bethel BornGekas, Limuel Nieblas Marie, PA-C 05/02/18 Windell [id number]    Alvira MondaySchlossman, Erin, MD 05/03/18 1101

## 2018-05-04 ENCOUNTER — Emergency Department (HOSPITAL_BASED_OUTPATIENT_CLINIC_OR_DEPARTMENT_OTHER)
Admission: EM | Admit: 2018-05-04 | Discharge: 2018-05-04 | Disposition: A | Discharge status: Home / Self Care | Payer: Self-pay | Attending: Emergency Medicine | Admitting: Emergency Medicine

## 2018-05-04 ENCOUNTER — Encounter (HOSPITAL_BASED_OUTPATIENT_CLINIC_OR_DEPARTMENT_OTHER): Payer: Self-pay | Admitting: Emergency Medicine

## 2018-05-04 ENCOUNTER — Other Ambulatory Visit: Payer: Self-pay

## 2018-05-04 DIAGNOSIS — F17210 Nicotine dependence, cigarettes, uncomplicated: Secondary | ICD-10-CM | POA: Insufficient documentation

## 2018-05-04 DIAGNOSIS — M25511 Pain in right shoulder: Secondary | ICD-10-CM | POA: Insufficient documentation

## 2018-05-04 NOTE — ED Triage Notes (Signed)
Patient states that she hurt her right shoulder earlier this week with lifting. She has been resting it for the week and then lifting a basket today and she feels like it hurts more again

## 2018-05-04 NOTE — ED Provider Notes (Signed)
[facility] EMERGENCY DEPARTMENT Provider Note   CSN: [id number] Arrival date & time: 05/04/18  1442     History   Chief Complaint Chief Complaint  Patient presents with  . Shoulder Pain    HPI Maria Davis is a 27 y.o. female.  27 y.o female with no PMH presents to the ED with a chief complaint of right shoulder pain x yesterday.Patient was in the ED on Thursday and was supposed to return back to work yesterday however she reports yesterday she picked up a heavy basket causing the pain on her right shoulder to return.  She was prescribed ibuprofen, muscle relaxers, heat patches to her right shoulder for improvement and states this is helping just that lifting this basket exacerbate her symptoms.  Reports she has not been taking the muscle relaxers as would be unable to drive.  Currently requesting a work note to return back on Monday after resting her shoulder over the weekend.  Denies any additional trauma, or fever.      History reviewed. No pertinent past medical history.  There are no active problems to display for this patient.   History reviewed. No pertinent surgical history.   OB History   None      Home Medications    Prior to Admission medications   Medication Sig Start Date End Date Taking? Authorizing Provider  cyclobenzaprine (FLEXERIL) 10 MG tablet Take 0.5 tablets (5 mg total) by mouth 3 (three) times daily as needed for muscle spasms. 05/02/18   Bethel Born, PA-C  EPINEPHrine (EPIPEN) 0.3 mg/0.3 mL SOAJ injection Inject 0.3 mLs (0.3 mg total) into the muscle as needed. Keep one in your purse, one in your home and one in your car and use in the event you suspect an allergic reaction. Then go to your nearest emergency room for further treatment. 06/04/13   Elpidio Anis, PA-C  ibuprofen (ADVIL,MOTRIN) 600 MG tablet Take 1 tablet (600 mg total) by mouth every 8 (eight) hours as needed. 05/02/18   Bethel Born, PA-C    Family  History History reviewed. No pertinent family history.  Social History Social History   Tobacco Use  . Smoking status: Current Every Day Smoker    Packs/day: 0.50    Types: Cigarettes  . Smokeless tobacco: Never Used  Substance Use Topics  . Alcohol use: Yes    Comment: occasional  . Drug use: No     Allergies   Tomato   Review of Systems Review of Systems  Constitutional: Negative for fever.  Musculoskeletal: Positive for arthralgias and myalgias.     Physical Exam Updated Vital Signs BP 127/75 (BP Location: Left Arm)   Pulse 87   Temp 98.6 F (37 C) (Oral)   Resp 18   Ht 5\' 3"  (1.6 m)   Wt 63.5 kg   SpO2 100%   BMI 24.80 kg/m   Physical Exam  Constitutional: She is oriented to person, place, and time. She appears well-developed and well-nourished. No distress.  HENT:  Head: Normocephalic and atraumatic.  Mouth/Throat: Oropharynx is clear and moist. No oropharyngeal exudate.  Eyes: Pupils are equal, round, and reactive to light.  Neck: Normal range of motion.  Cardiovascular: Regular rhythm and normal heart sounds.  Pulmonary/Chest: Effort normal and breath sounds normal. No respiratory distress.  Abdominal: Soft. Bowel sounds are normal. She exhibits no distension. There is no tenderness.  Musculoskeletal: She exhibits no deformity.       Right shoulder: She exhibits pain.  She exhibits normal range of motion, no tenderness, no bony tenderness, no swelling, no effusion, no crepitus, normal pulse and normal strength.       Right lower leg: She exhibits no edema.       Left lower leg: She exhibits no edema.  Pulses present, capillary refill is intact.  Good grip strength.  Neurological: She is alert and oriented to person, place, and time.  Skin: Skin is warm and dry.  Psychiatric: She has a normal mood and affect.  Nursing note and vitals reviewed.    ED Treatments / Results  Labs (all labs ordered are listed, but only abnormal results are  displayed) Labs Reviewed - No data to display  EKG None  Radiology No results found.  Procedures Procedures (including critical care time)  Medications Ordered in ED Medications - No data to display   Initial Impression / Assessment and Plan / ED Course  I have reviewed the triage vital signs and the nursing notes.  Pertinent labs & imaging results that were available during my care of the patient were reviewed by me and considered in my medical decision making (see chart for details).     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  Was returned back to work yesterday but she reports prior to going into work she lifted a heavy basket and this caused the pain in her shoulder to worsen.  Has been taking ibuprofen which she reports is helping with her symptoms but has not been taking the muscle relaxers as this she is unable to drive while taking them.  Is here requesting a work note to return on Monday after resting her shoulder over the weekend.  Provided orthopedic referral for patient to further evaluate her shoulder pain if it does not get better next week by an orthopedist.  He understands and agrees with management.  Return precautions provided. Final Clinical Impressions(s) / ED Diagnoses   Final diagnoses:  Acute pain of right shoulder    ED Discharge Orders    None       Claude MangesSoto, Allyson Tineo, PA-C 05/04/18 1620    Virgina NorfolkCuratolo, Adam, DO 05/05/18 0000

## 2018-05-04 NOTE — Discharge Instructions (Signed)
Please continue to take the ibuprofen for the pain along with the muscle relaxers to help loosen the muscle stiffness on your right shoulder.  Also use heating pad to apply to your right shoulder.  I have Provided a referral to an orthopedist if the pain does not improve you may schedule an appointment with them to further evaluate your condition.

## 2019-03-21 ENCOUNTER — Encounter (HOSPITAL_BASED_OUTPATIENT_CLINIC_OR_DEPARTMENT_OTHER): Payer: Self-pay

## 2019-03-21 ENCOUNTER — Emergency Department (HOSPITAL_BASED_OUTPATIENT_CLINIC_OR_DEPARTMENT_OTHER)
Admission: EM | Admit: 2019-03-21 | Discharge: 2019-03-21 | Disposition: A | Discharge status: Home / Self Care | Payer: Medicaid Other | Attending: Emergency Medicine | Admitting: Emergency Medicine

## 2019-03-21 ENCOUNTER — Other Ambulatory Visit: Payer: Self-pay

## 2019-03-21 DIAGNOSIS — F17210 Nicotine dependence, cigarettes, uncomplicated: Secondary | ICD-10-CM | POA: Insufficient documentation

## 2019-03-21 DIAGNOSIS — B309 Viral conjunctivitis, unspecified: Secondary | ICD-10-CM | POA: Insufficient documentation

## 2019-03-21 MED ORDER — TETRACAINE HCL 0.5 % OP SOLN
1.0000 [drp] | Freq: Once | OPHTHALMIC | Status: AC
  Administered 2019-03-21: 17:00:00 1 [drp] via OPHTHALMIC

## 2019-03-21 MED ORDER — FLUORESCEIN SODIUM 1 MG OP STRP
1.0000 | ORAL_STRIP | Freq: Once | OPHTHALMIC | Status: AC
  Administered 2019-03-21: 1 via OPHTHALMIC

## 2019-03-21 MED ORDER — TETRACAINE HCL 0.5 % OP SOLN
OPHTHALMIC | Status: AC
  Administered 2019-03-21: 1 [drp] via OPHTHALMIC
  Filled 2019-03-21: qty 4

## 2019-03-21 MED ORDER — ERYTHROMYCIN 5 MG/GM OP OINT: TOPICAL_OINTMENT | OPHTHALMIC | 0 refills | Status: DC

## 2019-03-21 MED ORDER — FLUORESCEIN SODIUM 1 MG OP STRP
ORAL_STRIP | OPHTHALMIC | Status: AC
  Filled 2019-03-21: qty 1

## 2019-03-21 MED FILL — ERYTHROMYCIN EYE OINTMENT: 5 | 10 days supply | Qty: 4 | Fill #0

## 2019-03-21 NOTE — ED Triage Notes (Signed)
Pt reports dry, itching and redness to R eye. Pt attempted to be seen at [facility] and was unable to due to their system down. Pt has been trying eye drops with no relief.

## 2019-03-21 NOTE — Discharge Instructions (Signed)
Use erythromycin ointment 4 times daily for one week Avoid rubbing the eye as much as possible and wash your hands frequently Use artificial tears to help with dry, irritated eyes Apply a cool compress the the eye to help with pain and swelling

## 2019-03-21 NOTE — ED Provider Notes (Signed)
[facility] Provider Note   CSN: [id number] Arrival date & time: 03/21/19  1552     History   Chief Complaint Chief Complaint  Patient presents with  . Eye Pain    HPI Maria Davis is a 28 y.o. female who presents with right eye redness.  No significant past medical history.  The patient states that since yesterday she woke up with an irritated and red right eye. She feels like there is a pressure/gritty sensation of the eye and there is crusting in the morning. She also reports some mild photophobia. She went to work but was sent home because of possible pink eye. She denies any symptoms in her left eye.  She does not wear contacts or glasses.  She had a runny nose and nasal congestion a couple of days ago which is resolved.  There is been no trauma to the eye.  No fever.  She was told she would need a note to return to work. She tried to go to [facility] today but was unable due to technical issues there.    HPI  History reviewed. No pertinent past medical history.  There are no active problems to display for this patient.   History reviewed. No pertinent surgical history.   OB History   No obstetric history on file.      Home Medications    Prior to Admission medications   Medication Sig Start Date End Date Taking? Authorizing Provider  cyclobenzaprine (FLEXERIL) 10 MG tablet Take 0.5 tablets (5 mg total) by mouth 3 (three) times daily as needed for muscle spasms. 05/02/18   Recardo Evangelist, PA-C  EPINEPHrine (EPIPEN) 0.3 mg/0.3 mL SOAJ injection Inject 0.3 mLs (0.3 mg total) into the muscle as needed. Keep one in your purse, one in your home and one in your car and use in the event you suspect an allergic reaction. Then go to your nearest emergency room for further treatment. 06/04/13   Charlann Lange, PA-C  ibuprofen (ADVIL,MOTRIN) 600 MG tablet Take 1 tablet (600 mg total) by mouth every 8 (eight) hours as needed. 05/02/18   Recardo Evangelist,  PA-C    Family History No family history on file.  Social History Social History   Tobacco Use  . Smoking status: Current Every Day Smoker    Packs/day: 0.50    Types: Cigarettes  . Smokeless tobacco: Never Used  Substance Use Topics  . Alcohol use: Yes    Comment: occasional  . Drug use: No     Allergies   Tomato   Review of Systems Review of Systems  Constitutional: Negative for fever.  Eyes: Positive for photophobia, pain, discharge and redness. Negative for itching and visual disturbance.     Physical Exam Updated Vital Signs BP 118/87 (BP Location: Right Arm)   Pulse 93   Temp 98.5 F (36.9 C) (Oral)   Resp 17   Ht 5\' 3"  (1.6 m)   SpO2 99%   BMI 24.80 kg/m   Physical Exam Vitals signs and nursing note reviewed.  Constitutional:      General: She is not in acute distress.    Appearance: Normal appearance. She is well-developed. She is not ill-appearing.  HENT:     Head: Normocephalic and atraumatic.  Eyes:     General: No scleral icterus.       Right eye: No discharge.        Left eye: No discharge.     Extraocular Movements:  Extraocular movements intact.     Conjunctiva/sclera:     Right eye: Right conjunctiva is injected.     Left eye: Left conjunctiva is not injected. No chemosis, exudate or hemorrhage.    Pupils: Pupils are equal, round, and reactive to light.     Right eye: Pupil is round, reactive and not sluggish. No corneal abrasion or fluorescein uptake.     Slit lamp exam:    Right eye: Photophobia present.     Left eye: No photophobia.     Comments: Brown spotted sclera bilaterally    Visual Acuity  Right Eye Distance: 20/25 Left Eye Distance: 20/25 Bilateral Distance: 20/25     Neck:     Musculoskeletal: Normal range of motion.  Cardiovascular:     Rate and Rhythm: Normal rate.  Pulmonary:     Effort: Pulmonary effort is normal. No respiratory distress.  Abdominal:     General: There is no distension.  Skin:    General:  Skin is warm and dry.  Neurological:     Mental Status: She is alert and oriented to person, place, and time.  Psychiatric:        Behavior: Behavior normal.      ED Treatments / Results  Labs (all labs ordered are listed, but only abnormal results are displayed) Labs Reviewed - No data to display  EKG None  Radiology No results found.  Procedures Procedures (including critical care time)  Medications Ordered in ED Medications - No data to display   Initial Impression / Assessment and Plan / ED Course  I have reviewed the triage vital signs and the nursing notes.  Pertinent labs & imaging results that were available during my care of the patient were reviewed by me and considered in my medical decision making (see chart for details).  28 year old female presents with eye redness and irritation for the past 1 to 2 days.  Her visual acuity is intact.  Exam is consistent with a viral conjunctivitis.  Will cover for any bacterial component with erythromycin ointment.  Advised artificial tears for comfort and cool compresses.  Discussed that viral conjunctivitis is very contagious and she should avoid touching her eye and should wash her hands frequently.  Work note was given.  Final Clinical Impressions(s) / ED Diagnoses   Final diagnoses:  Acute viral conjunctivitis of right eye    ED Discharge Orders    None       Bethel Born, PA-C 03/21/19 1747    Tegeler, Canary Brim, MD 03/22/19 0003

## 2019-12-26 ENCOUNTER — Encounter (HOSPITAL_BASED_OUTPATIENT_CLINIC_OR_DEPARTMENT_OTHER): Payer: Self-pay | Admitting: *Deleted

## 2019-12-26 ENCOUNTER — Emergency Department (HOSPITAL_BASED_OUTPATIENT_CLINIC_OR_DEPARTMENT_OTHER)
Admission: EM | Admit: 2019-12-26 | Discharge: 2019-12-27 | Disposition: A | Discharge status: Home / Self Care | Payer: Medicaid Other | Attending: Emergency Medicine | Admitting: Emergency Medicine

## 2019-12-26 ENCOUNTER — Other Ambulatory Visit: Payer: Self-pay

## 2019-12-26 DIAGNOSIS — Z5321 Procedure and treatment not carried out due to patient leaving prior to being seen by health care provider: Secondary | ICD-10-CM | POA: Insufficient documentation

## 2019-12-26 DIAGNOSIS — R1030 Lower abdominal pain, unspecified: Secondary | ICD-10-CM

## 2019-12-26 LAB — COMPREHENSIVE METABOLIC PANEL
ALT: 16 U/L (ref 0–44)
AST: 16 U/L (ref 15–41)
Albumin: 4.1 g/dL (ref 3.5–5.0)
Alkaline Phosphatase: 55 U/L (ref 38–126)
Anion gap: 11 (ref 5–15)
BUN: 8 mg/dL (ref 6–20)
CO2: 23 mmol/L (ref 22–32)
Calcium: 9.2 mg/dL (ref 8.9–10.3)
Chloride: 104 mmol/L (ref 98–111)
Creatinine, Ser: 0.67 mg/dL (ref 0.44–1.00)
GFR calc Af Amer: >60 mL/min (ref >60)
GFR calc non Af Amer: >60 mL/min (ref >60)
Glucose, Bld: 58 mg/dL — ABNORMAL LOW (ref 70–99)
Potassium: 3.3 mmol/L — ABNORMAL LOW (ref 3.5–5.1)
Sodium: 138 mmol/L (ref 135–145)
Total Bilirubin: 0.1 mg/dL — ABNORMAL LOW (ref 0.3–1.2)
Total Protein: 7.7 g/dL (ref 6.5–8.1)

## 2019-12-26 LAB — URINALYSIS, MICROSCOPIC (REFLEX)

## 2019-12-26 LAB — LIPASE, BLOOD: Lipase: 29 U/L (ref 11–51)

## 2019-12-26 LAB — CBC
HCT: 40.5 % (ref 36.0–46.0)
Hemoglobin: 12.9 g/dL (ref 12.0–15.0)
MCH: 26.4 pg (ref 26.0–34.0)
MCHC: 31.9 g/dL (ref 30.0–36.0)
MCV: 82.8 fL (ref 80.0–100.0)
Platelets: 265 10*3/uL (ref 150–400)
RBC: 4.89 MIL/uL (ref 3.87–5.11)
RDW: 14.8 % (ref 11.5–15.5)
WBC: 7.3 10*3/uL (ref 4.0–10.5)
nRBC: 0 % (ref 0.0–0.2)

## 2019-12-26 LAB — URINALYSIS, ROUTINE W REFLEX MICROSCOPIC
Bilirubin Urine: NEGATIVE
Glucose, UA: NEGATIVE mg/dL
Ketones, ur: NEGATIVE mg/dL
Leukocytes,Ua: NEGATIVE
Nitrite: NEGATIVE
Protein, ur: NEGATIVE mg/dL
Specific Gravity, Urine: 1.025 (ref 1.005–1.030)
pH: 6 (ref 5.0–8.0)

## 2019-12-26 LAB — PREGNANCY, URINE: Preg Test, Ur: NEGATIVE

## 2019-12-26 MED ORDER — SODIUM CHLORIDE 0.9% FLUSH
3.0000 mL | Freq: Once | INTRAVENOUS | Status: DC
  Filled 2019-12-26: qty 3

## 2019-12-26 NOTE — ED Triage Notes (Signed)
Sudden onset of lower abdominal pain tonight. No diarrhea, vomiting, vaginal discharge.

## 2019-12-26 NOTE — ED Provider Notes (Signed)
[facility]MENT Provider Note   CSN: [id number] Arrival date & time: 12/26/19  1920     History Chief Complaint  Patient presents with  . Abdominal Pain    Maria Davis is a 29 y.o. female.  29 yo F with a chief complaints of lower abdominal pain.  This is described as sharp.  Occurred while the patient was eating at [employer].  States that it got severe and has progressively gotten better since the onset.  This is been about 3 to 4 hours.  Now is down to a 3.  She denies urinary symptoms denies constipation or diarrhea denies fevers denies nausea or vomiting.  She denies vaginal bleeding or discharge.  The history is provided by the patient.  Abdominal Pain Pain location:  LLQ and RLQ Pain quality: aching and cramping   Pain radiates to:  Does not radiate Pain severity:  Moderate Onset quality:  Gradual Duration:  5 hours Timing:  Constant Progression:  Partially resolved Chronicity:  New Context: eating   Relieved by:  Nothing Worsened by:  Nothing Ineffective treatments:  None tried Associated symptoms: no chest pain, no chills, no constipation, no diarrhea, no dysuria, no fever, no nausea, no shortness of breath, no vaginal bleeding, no vaginal discharge and no vomiting        History reviewed. No pertinent past medical history.  There are no problems to display for this patient.   History reviewed. No pertinent surgical history.   OB History   No obstetric history on file.     No family history on file.  Social History   Tobacco Use  . Smoking status: Current Every Day Smoker    Packs/day: 0.50    Types: Cigarettes  . Smokeless tobacco: Never Used  Substance Use Topics  . Alcohol use: Yes    Comment: occasional  . Drug use: No    Home Medications Prior to Admission medications   Medication Sig Start Date End Date Taking? Authorizing Provider  cyclobenzaprine (FLEXERIL) 10 MG tablet Take 0.5 tablets (5 mg total) by  mouth 3 (three) times daily as needed for muscle spasms. 05/02/18   Bethel Born, PA-C  EPINEPHrine (EPIPEN) 0.3 mg/0.3 mL SOAJ injection Inject 0.3 mLs (0.3 mg total) into the muscle as needed. Keep one in your purse, one in your home and one in your car and use in the event you suspect an allergic reaction. Then go to your nearest emergency room for further treatment. 06/04/13   Elpidio Anis, PA-C  erythromycin ophthalmic ointment Place a 1/2 inch ribbon of ointment into the lower eyelid 4 times daily 03/21/19   Bethel Born, PA-C  ibuprofen (ADVIL,MOTRIN) 600 MG tablet Take 1 tablet (600 mg total) by mouth every 8 (eight) hours as needed. 05/02/18   Bethel Born, PA-C    Allergies    Tomato  Review of Systems   Review of Systems  Constitutional: Negative for chills and fever.  HENT: Negative for congestion and rhinorrhea.   Eyes: Negative for redness and visual disturbance.  Respiratory: Negative for shortness of breath and wheezing.   Cardiovascular: Negative for chest pain and palpitations.  Gastrointestinal: Positive for abdominal pain. Negative for constipation, diarrhea, nausea and vomiting.  Genitourinary: Negative for dysuria, urgency, vaginal bleeding and vaginal discharge.  Musculoskeletal: Negative for arthralgias and myalgias.  Skin: Negative for pallor and wound.  Neurological: Negative for dizziness and headaches.    Physical Exam Updated Vital Signs BP 127/71  Pulse 79   Temp 98.5 F (36.9 C) (Oral)   Resp 18   Ht 5\' 3"  (1.6 m)   Wt 83 kg   SpO2 100%   BMI 32.42 kg/m   Physical Exam Vitals and nursing note reviewed.  Constitutional:      General: She is not in acute distress.    Appearance: She is well-developed. She is not diaphoretic.  HENT:     Head: Normocephalic and atraumatic.  Eyes:     Pupils: Pupils are equal, round, and reactive to light.  Cardiovascular:     Rate and Rhythm: Normal rate and regular rhythm.     Heart  sounds: No murmur heard.  No friction rub. No gallop.   Pulmonary:     Effort: Pulmonary effort is normal.     Breath sounds: No wheezing or rales.  Abdominal:     General: There is no distension.     Palpations: Abdomen is soft.     Tenderness: There is abdominal tenderness in the right lower quadrant, suprapubic area and left lower quadrant.     Comments: Mild diffuse lower abdominal pain  Musculoskeletal:        General: No tenderness.     Cervical back: Normal range of motion and neck supple.  Skin:    General: Skin is warm and dry.  Neurological:     Mental Status: She is alert and oriented to person, place, and time.  Psychiatric:        Behavior: Behavior normal.     ED Results / Procedures / Treatments   Labs (all labs ordered are listed, but only abnormal results are displayed) Labs Reviewed  COMPREHENSIVE METABOLIC PANEL - Abnormal; Notable for the following components:      Result Value   Potassium 3.3 (*)    Glucose, Bld 58 (*)    Total Bilirubin 0.1 (*)    All other components within normal limits  URINALYSIS, ROUTINE W REFLEX MICROSCOPIC - Abnormal; Notable for the following components:   Hgb urine dipstick MODERATE (*)    All other components within normal limits  URINALYSIS, MICROSCOPIC (REFLEX) - Abnormal; Notable for the following components:   Bacteria, UA MANY (*)    All other components within normal limits  LIPASE, BLOOD  CBC  PREGNANCY, URINE  CBG MONITORING, ED    EKG None  Radiology No results found.  Procedures Procedures (including critical care time)  Medications Ordered in ED Medications  sodium chloride flush (NS) 0.9 % injection 3 mL (3 mLs Intravenous Not Given 12/26/19 2228)    ED Course  I have reviewed the triage vital signs and the nursing notes.  Pertinent labs & imaging results that were available during my care of the patient were reviewed by me and considered in my medical decision making (see chart for details).      MDM Rules/Calculators/A&P                          29 yo F with a chief complaints of lower abdominal pain.  This been going on for a few hours now.  Started while she was eating at 26.  Has gotten progressively better since she is arrived in the ED.  She is well-appearing and nontoxic tolerating p.o. without difficulty.  Her only lab of note is she was hypoglycemic.  This is of unknown etiology.  Not on any medications to lower her blood sugar at home.  We will recheck a blood sugar here.  I discussed performing a pelvic exam and the patient is currently declining.    Patient eloped prior to obtaining the CBG.  12:04 AM:  I have discussed the diagnosis/risks/treatment options with the patient and believe the pt to be eligible for discharge home to follow-up with PCP. We also discussed returning to the ED immediately if new or worsening sx occur. We discussed the sx which are most concerning (e.g., sudden worsening pain, fever, inability to tolerate by mouth) that necessitate immediate return. Medications administered to the patient during their visit and any new prescriptions provided to the patient are listed below.  Medications given during this visit Medications  sodium chloride flush (NS) 0.9 % injection 3 mL (3 mLs Intravenous Not Given 12/26/19 2228)     The patient appears reasonably screen and/or stabilized for discharge and I doubt any other medical condition or other [facility] requiring further screening, evaluation, or treatment in the ED at this time prior to discharge.   Final Clinical Impression(s) / ED Diagnoses Final diagnoses:  None    Rx / DC Orders ED Discharge Orders    None       Melene Plan, DO 12/27/19 0004

## 2019-12-26 NOTE — ED Notes (Signed)
Patient left after speaking with provider. Did not notify staff and is not observed in ED at this time. Provider informed.

## 2020-02-02 ENCOUNTER — Encounter (HOSPITAL_BASED_OUTPATIENT_CLINIC_OR_DEPARTMENT_OTHER): Payer: Self-pay | Admitting: *Deleted

## 2020-02-02 ENCOUNTER — Other Ambulatory Visit: Payer: Self-pay

## 2020-02-02 DIAGNOSIS — R0602 Shortness of breath: Secondary | ICD-10-CM | POA: Insufficient documentation

## 2020-02-02 DIAGNOSIS — Z5321 Procedure and treatment not carried out due to patient leaving prior to being seen by health care provider: Secondary | ICD-10-CM | POA: Insufficient documentation

## 2020-02-02 NOTE — ED Triage Notes (Signed)
C/o SOB x 1 hr

## 2020-02-03 ENCOUNTER — Emergency Department (HOSPITAL_BASED_OUTPATIENT_CLINIC_OR_DEPARTMENT_OTHER)
Admission: EM | Admit: 2020-02-03 | Discharge: 2020-02-03 | Disposition: A | Discharge status: Home / Self Care | Payer: Medicaid Other | Attending: Emergency Medicine | Admitting: Emergency Medicine

## 2020-02-03 NOTE — ED Notes (Signed)
No answer when called for room assignment  

## 2020-06-19 NOTE — L&D Delivery Note (Signed)
OB[facility]N Faculty Practice Delivery Note  Maria Davis is a 30 y.o. G2P1001 s/p SVD at [redacted]w[redacted]d. She was admitted for SOL.   ROM: 9h 5m with clear which transitioned to light meconium fluid GBS Status:  Negative/-- (09/08 1014) Maximum Maternal Temperature:  Temp (24hrs), Avg:98.4 F (36.9 C), Min:98.1 F (36.7 C), Max:98.7 F (37.1 C)    Labor Progress: Patient arrived at 4 cm dilation and was induced with pitocin.   Delivery Date/Time: 03/18/2021 at 1418 Delivery: Called to room and patient was complete and pushing. Head delivered in ROA position. No nuchal cord present. Head delivered in normal fashion followed by 45 second shoulder dystocia which resolved with McRoberts and fundal pressure . Infant with spontaneous cry, placed on mother's abdomen, dried and stimulated. Cord clamped x 2 after 1-minute delay, and cut by patients sister. Cord blood drawn. Placenta delivered spontaneously with gentle cord traction. Fundus firm with massage and Pitocin. Labia, perineum, vagina, and cervix inspected with 1st degree perineal lacerations.   Placenta: spontaneous, intact, 3 vessel cord  Complications: 45 second shoulder dystocia  Lacerations: 1st dregree perineal repaired with 3-0 vicryl suture  EBL: 200 cc Analgesia: epidural    Infant: APGAR (1 MIN): 9   APGAR (5 MINS): 9   APGAR (10 MINS):    Weight: 3311 g  Derrel Nip, MD  PGY-3, [facility]  03/18/2021 4:03 PM

## 2020-08-18 ENCOUNTER — Ambulatory Visit (INDEPENDENT_AMBULATORY_CARE_PROVIDER_SITE_OTHER): Payer: BC Managed Care – PPO | Admitting: Family Medicine

## 2020-08-18 ENCOUNTER — Encounter: Payer: Self-pay | Admitting: Family Medicine

## 2020-08-18 ENCOUNTER — Other Ambulatory Visit: Payer: Self-pay

## 2020-08-18 ENCOUNTER — Other Ambulatory Visit (HOSPITAL_COMMUNITY)
Admission: RE | Admit: 2020-08-18 | Discharge: 2020-08-18 | Disposition: A | Discharge status: Home / Self Care | Payer: BC Managed Care – PPO | Source: Ambulatory Visit | Attending: Family Medicine | Admitting: Family Medicine

## 2020-08-18 VITALS — BP 108/62 | HR 75 | Wt 181.0 lb

## 2020-08-18 DIAGNOSIS — D069 Carcinoma in situ of cervix, unspecified: Secondary | ICD-10-CM | POA: Insufficient documentation

## 2020-08-18 DIAGNOSIS — Z3480 Encounter for supervision of other normal pregnancy, unspecified trimester: Secondary | ICD-10-CM

## 2020-08-18 DIAGNOSIS — Z9889 Other specified postprocedural states: Secondary | ICD-10-CM | POA: Diagnosis present

## 2020-08-18 DIAGNOSIS — Z3A09 9 weeks gestation of pregnancy: Secondary | ICD-10-CM | POA: Insufficient documentation

## 2020-08-18 DIAGNOSIS — Z3481 Encounter for supervision of other normal pregnancy, first trimester: Secondary | ICD-10-CM | POA: Insufficient documentation

## 2020-08-18 MED ORDER — PRENATAL VITAMIN 27-0.8 MG PO TABS: ORAL_TABLET | ORAL | 11 refills | Status: AC

## 2020-08-18 NOTE — Progress Notes (Signed)
Subjective:  Maria Davis is a G2P1001 [redacted]w[redacted]d being seen today for her first obstetrical visit.  Her obstetrical history is significant for NSVD 14 years ago. No complications to pregnancy. This was an unplanned pregnancy. FOB is patient's partner. Does have history of LEEP about 6 months ago for HSIL on colposcopy. LEEP path results not available. Patient does not intend to breast feed. Pregnancy history fully reviewed.  Patient reports no complaints.  BP 108/62   Pulse 75   Wt 181 lb (82.1 kg)   LMP 06/14/2020   BMI 32.06 kg/m   HISTORY: OB History  Gravida Para Term Preterm AB Living  2 1 1     1   SAB IAB Ectopic Multiple Live Births          1    # Outcome Date GA Lbr Len/2nd Weight Sex Delivery Anes PTL Lv  2 Current           1 Term 2008 [redacted]w[redacted]d   M Vag-Spont EPI N LIV    History reviewed. No pertinent past medical history.  History reviewed. No pertinent surgical history.  History reviewed. No pertinent family history.   Exam  BP 108/62   Pulse 75   Wt 181 lb (82.1 kg)   LMP 06/14/2020   BMI 32.06 kg/m   Chaperone present during exam  CONSTITUTIONAL: Well-developed, well-nourished female in no acute distress.  HENT:  Normocephalic, atraumatic, External right and left ear normal. Oropharynx is clear and moist EYES: Conjunctivae and EOM are normal. Pupils are equal, round, and reactive to light. No scleral icterus.  NECK: Normal range of motion, supple, no masses.  Normal thyroid.  CARDIOVASCULAR: Normal heart rate noted, regular rhythm RESPIRATORY: Clear to auscultation bilaterally. Effort and breath sounds normal, no problems with respiration noted. BREASTS: Symmetric in size. No masses, skin changes, nipple drainage, or lymphadenopathy. ABDOMEN: Soft, normal bowel sounds, no distention noted.  No tenderness, rebound or guarding.  PELVIC: Normal appearing external genitalia; normal appearing vaginal mucosa and cervix. No abnormal discharge noted. Normal uterine  size, no other palpable masses, no uterine or adnexal tenderness. MUSCULOSKELETAL: Normal range of motion. No tenderness.  No cyanosis, clubbing, or edema.  2+ distal pulses. SKIN: Skin is warm and dry. No rash noted. Not diaphoretic. No erythema. No pallor. NEUROLOGIC: Alert and oriented to person, place, and time. Normal reflexes, muscle tone coordination. No cranial nerve deficit noted. PSYCHIATRIC: Normal mood and affect. Normal behavior. Normal judgment and thought content.    Assessment:    Pregnancy: G2P1001 Patient Active Problem List   Diagnosis Date Noted  . Supervision of other normal pregnancy, antepartum 08/18/2020  . History of loop electrical excision procedure (LEEP) 08/18/2020      Plan:   1. Supervision of other normal pregnancy, antepartum FHT and FH normal Discussed practice Delivery at @ [facility] Desires panorama, which we will get next visit.  - Hemoglobpathy+Fer w/A Thal Rfx - CBC/D/Plt+RPR+Rh+ABO+Rub Ab... - Culture, OB Urine - Hemoglobin A1c - CHL AMB BABYSCRIPTS OPT IN - Cervicovaginal ancillary only( [place]) - Prenatal Vit-Fe Fumarate-FA (PRENATAL VITAMIN) 27-0.8 MG TABS; Take 1 tablet daily.  Dispense: 30 tablet; Refill: 11  2. History of loop electrical excision procedure (LEEP) Normal cervix appearance today. Will need CL at 16-20 week [facility]. - Hemoglobpathy+Fer w/A Thal Rfx - CBC/D/Plt+RPR+Rh+ABO+Rub Ab... - Culture, OB Urine - Hemoglobin A1c - CHL AMB BABYSCRIPTS OPT IN - Cervicovaginal ancillary only( [place])  3. [redacted] weeks gestation of pregnancy  -  Hemoglobpathy+Fer w/A Thal Rfx - CBC/D/Plt+RPR+Rh+ABO+Rub Ab... - Culture, OB Urine - Hemoglobin A1c - CHL AMB BABYSCRIPTS OPT IN - Cervicovaginal ancillary only( [place]) - Prenatal Vit-Fe Fumarate-FA (PRENATAL VITAMIN) 27-0.8 MG TABS; Take 1 tablet daily.  Dispense: 30 tablet; Refill: 11     Problem list reviewed and updated. 75% of 30 min visit spent on counseling and  coordination of care.     Levie Heritage 08/18/2020

## 2020-08-18 NOTE — Progress Notes (Signed)
DATING AND VIABILITY SONOGRAM   Maria Davis is a 30 y.o. year old G2P1001 with LMP Patient's last menstrual period was 06/14/2020. which would correlate to  [redacted]w[redacted]d weeks gestation.  She has regular menstrual cycles.   She is here today for a confirmatory initial sonogram.    GESTATION: SINGLETON     FETAL ACTIVITY:          Heart rate    179 bom          The fetus is active.   ADNEXA: The ovaries are normal.   GESTATIONAL AGE AND  BIOMETRICS:  Gestational criteria: Estimated Date of Delivery: 03/21/21 by LMP now at [redacted]w[redacted]d  Previous Scans:0  GESTATIONAL SAC           cm         9-1weeks  CROWN RUMP LENGTH           2.54 cm         9-2 weeks                                                                               AVERAGE EGA(BY THIS SCAN):  9-2weeks  WORKING EDD( LMP ):  03-21-2021     TECHNICIAN COMMENTS:    Armandina Stammer 08/18/2020 9:48 AM

## 2020-08-19 LAB — CERVICOVAGINAL ANCILLARY ONLY
Chlamydia: NEGATIVE
Comment: NEGATIVE
Comment: NORMAL
Neisseria Gonorrhea: NEGATIVE

## 2020-08-20 LAB — URINE CULTURE, OB REFLEX

## 2020-08-20 LAB — CULTURE, OB URINE

## 2020-09-16 ENCOUNTER — Other Ambulatory Visit: Payer: Self-pay

## 2020-09-16 ENCOUNTER — Ambulatory Visit (INDEPENDENT_AMBULATORY_CARE_PROVIDER_SITE_OTHER): Payer: BC Managed Care – PPO | Admitting: Family Medicine

## 2020-09-16 VITALS — BP 114/59 | HR 87 | Wt 179.0 lb

## 2020-09-16 DIAGNOSIS — Z3480 Encounter for supervision of other normal pregnancy, unspecified trimester: Secondary | ICD-10-CM

## 2020-09-16 DIAGNOSIS — Z3A13 13 weeks gestation of pregnancy: Secondary | ICD-10-CM

## 2020-09-16 DIAGNOSIS — R42 Dizziness and giddiness: Secondary | ICD-10-CM

## 2020-09-16 DIAGNOSIS — R55 Syncope and collapse: Secondary | ICD-10-CM

## 2020-09-16 DIAGNOSIS — Z9889 Other specified postprocedural states: Secondary | ICD-10-CM

## 2020-09-16 NOTE — Progress Notes (Signed)
   PRENATAL VISIT NOTE  Subjective:  Maria Davis is a 30 y.o. G2P1001 at [redacted]w[redacted]d being seen today for ongoing prenatal care.  She is currently monitored for the following issues for this high-risk pregnancy and has Supervision of other normal pregnancy, antepartum and History of loop electrical excision procedure (LEEP) on their problem list.  Patient reports episode of presyncope at work. Had eaten a few minutes prior. Saw nurse at work, didn't take blood pressure. She went home and saw that her BP was elevated (grandmother's cuff). no symptoms since.  Contractions: Not present. Vag. Bleeding: None.  Movement: Absent. Denies leaking of fluid.   The following portions of the patient's history were reviewed and updated as appropriate: allergies, current medications, past family history, past medical history, past social history, past surgical history and problem list.   Objective:   Vitals:   09/16/20 0948  BP: (!) 114/59  Pulse: 87  Weight: 179 lb (81.2 kg)    Fetal Status: Fetal Heart Rate (bpm): 155 (Simultaneous filing. User may not have seen previous data.)   Movement: Absent     General:  Alert, oriented and cooperative. Patient is in no acute distress.  Skin: Skin is warm and dry. No rash noted.   Cardiovascular: Normal heart rate noted  Respiratory: Normal respiratory effort, no problems with respiration noted  Abdomen: Soft, gravid, appropriate for gestational age.  Pain/Pressure: Absent     Pelvic: Cervical exam deferred        Extremities: Normal range of motion.  Edema: None  Mental Status: Normal mood and affect. Normal behavior. Normal judgment and thought content.   Assessment and Plan:  Pregnancy: G2P1001 at [redacted]w[redacted]d 1. [redacted] weeks gestation of pregnancy - Genetic Screening - Comp Met (CMET) - CBC/D/Plt+RPR+Rh+ABO+Rub Ab...  2. Supervision of other normal pregnancy, antepartum FHT and FH normal - Genetic Screening - Comp Met (CMET) - CBC/D/Plt+RPR+Rh+ABO+Rub  Ab...  3. History of loop electrical excision procedure (LEEP) CL at anatomy scan  4. Postural dizziness with presyncope PNL not done last appt. Check them today with CBC and CMP.  Preterm labor symptoms and general obstetric precautions including but not limited to vaginal bleeding, contractions, leaking of fluid and fetal movement were reviewed in detail with the patient. Please refer to After Visit Summary for other counseling recommendations.   Return in about 4 weeks (around 10/14/2020).  Future Appointments  Date Time Provider [place]  10/14/2020  9:30 AM Truett Mainland, DO [facility] None    Truett Mainland, DO

## 2020-09-16 NOTE — Progress Notes (Signed)
Patient states she has been out of work for feeling dizzy and feeling faint. Patient states she got a blood pressure ready with systolic in 170s - asked patient what she did before she took it and she replied walked to her grandmothers house.

## 2020-09-17 LAB — COMPREHENSIVE METABOLIC PANEL
ALT: 23 IU/L (ref 0–32)
AST: 16 IU/L (ref 0–40)
Albumin/Globulin Ratio: 1.5 (ref 1.2–2.2)
Albumin: 3.9 g/dL (ref 3.8–4.8)
Alkaline Phosphatase: 58 IU/L (ref 44–121)
BUN/Creatinine Ratio: 14 (ref 9–23)
BUN: 6 mg/dL (ref 6–20)
Bilirubin Total: <0.2 mg/dL (ref 0.0–1.2)
CO2: 19 mmol/L — ABNORMAL LOW (ref 20–29)
Calcium: 9.4 mg/dL (ref 8.7–10.2)
Chloride: 104 mmol/L (ref 96–106)
Creatinine, Ser: 0.43 mg/dL — ABNORMAL LOW (ref 0.57–1.00)
Globulin, Total: 2.6 g/dL (ref 1.5–4.5)
Glucose: 76 mg/dL (ref 65–99)
Potassium: 3.6 mmol/L (ref 3.5–5.2)
Sodium: 137 mmol/L (ref 134–144)
Total Protein: 6.5 g/dL (ref 6.0–8.5)
eGFR: 133 mL/min/{1.73_m2} (ref >59)

## 2020-09-17 LAB — CBC/D/PLT+RPR+RH+ABO+RUB AB...
Antibody Screen: NEGATIVE
Basophils Absolute: 0 10*3/uL (ref 0.0–0.2)
Basos: 0 %
EOS (ABSOLUTE): 0.1 10*3/uL (ref 0.0–0.4)
Eos: 1 %
HCV Ab: <0.1 s/co ratio (ref 0.0–0.9)
HIV Screen 4th Generation wRfx: NONREACTIVE
Hematocrit: 34.1 % (ref 34.0–46.6)
Hemoglobin: 11.2 g/dL (ref 11.1–15.9)
Hepatitis B Surface Ag: NEGATIVE
Immature Grans (Abs): 0 10*3/uL (ref 0.0–0.1)
Immature Granulocytes: 0 %
Lymphocytes Absolute: 2.1 10*3/uL (ref 0.7–3.1)
Lymphs: 34 %
MCH: 26.1 pg — ABNORMAL LOW (ref 26.6–33.0)
MCHC: 32.8 g/dL (ref 31.5–35.7)
MCV: 80 fL (ref 79–97)
Monocytes Absolute: 0.4 10*3/uL (ref 0.1–0.9)
Monocytes: 6 %
Neutrophils Absolute: 3.7 10*3/uL (ref 1.4–7.0)
Neutrophils: 59 %
Platelets: 247 10*3/uL (ref 150–450)
RBC: 4.29 x10E6/uL (ref 3.77–5.28)
RDW: 13.8 % (ref 11.7–15.4)
RPR Ser Ql: NONREACTIVE
Rh Factor: POSITIVE
Rubella Antibodies, IGG: 4.22 index (ref >0.99)
WBC: 6.3 10*3/uL (ref 3.4–10.8)

## 2020-09-17 LAB — HCV INTERPRETATION

## 2020-09-30 ENCOUNTER — Encounter: Payer: Self-pay | Admitting: Family Medicine

## 2020-09-30 DIAGNOSIS — D563 Thalassemia minor: Secondary | ICD-10-CM | POA: Insufficient documentation

## 2020-10-14 ENCOUNTER — Ambulatory Visit (INDEPENDENT_AMBULATORY_CARE_PROVIDER_SITE_OTHER): Payer: BC Managed Care – PPO | Admitting: Family Medicine

## 2020-10-14 ENCOUNTER — Other Ambulatory Visit: Payer: Self-pay

## 2020-10-14 VITALS — BP 110/58 | HR 93 | Wt 182.0 lb

## 2020-10-14 DIAGNOSIS — Z3A17 17 weeks gestation of pregnancy: Secondary | ICD-10-CM

## 2020-10-14 NOTE — Progress Notes (Signed)
   PRENATAL VISIT NOTE  Subjective:  Maria Davis is a 30 y.o. G2P1001 at [redacted]w[redacted]d being seen today for ongoing prenatal care.  She is currently monitored for the following issues for this high-risk pregnancy and has Supervision of other normal pregnancy, antepartum; History of loop electrical excision procedure (LEEP); and Alpha thalassemia silent carrier on their problem list.  Patient reports no complaints. She denies more presyncopal episodes. Feeling the baby move. No N/v.   Contractions: Not present. Vag. Bleeding: None.  Movement: Present. Denies leaking of fluid.   The following portions of the patient's history were reviewed and updated as appropriate: allergies, current medications, past family history, past medical history, past social history, past surgical history and problem list.   Objective:   Vitals:   10/14/20 0921  BP: (!) 110/58  Pulse: 93  Weight: 182 lb (82.6 kg)    Fetal Status: Fetal Heart Rate (bpm): 163   Movement: Present     General:  Alert, oriented and cooperative. Patient is in no acute distress.  Skin: Skin is warm and dry. No rash noted.   Cardiovascular: Normal heart rate noted  Respiratory: Normal respiratory effort, no problems with respiration noted  Abdomen: Soft, gravid, appropriate for gestational age.  Pain/Pressure: Absent     Pelvic: Cervical exam deferred        Extremities: Normal range of motion.  Edema: None  Mental Status: Normal mood and affect. Normal behavior. Normal judgment and thought content.   Assessment and Plan:  Pregnancy: G2P1001 at [redacted]w[redacted]d 1. [redacted] weeks gestation of pregnancy AFP today [place] 10/25/20   2. Supervision of other normal pregnancy, antepartum FHT and FH normal  3. History of loop electrical excision procedure (LEEP) CL at anatomy scan   Preterm labor symptoms and general obstetric precautions including but not limited to vaginal bleeding, contractions, leaking of fluid and fetal movement were reviewed in detail  with the patient. Please refer to After Visit Summary for other counseling recommendations.   No follow-ups on file.  Future Appointments  Date Time Provider Department Center  10/25/2020 10:15 AM [facility] NURSE [facility]  10/25/2020 10:30 AM [facility] US3 [facility]    Levie Heritage, DO

## 2020-10-16 LAB — AFP, SERUM, OPEN SPINA BIFIDA
AFP MoM: 0.93
AFP Value: 37.5 ng/mL
Gest. Age on Collection Date: 17.3 weeks
Maternal Age At EDD: 32 yr
OSBR Risk 1 IN: 10000
Test Results:: NEGATIVE
Weight: 182 [lb_av]

## 2020-10-25 ENCOUNTER — Encounter: Payer: Self-pay | Admitting: *Deleted

## 2020-10-25 ENCOUNTER — Ambulatory Visit: Payer: BC Managed Care – PPO | Attending: Family Medicine

## 2020-10-25 ENCOUNTER — Ambulatory Visit: Payer: BC Managed Care – PPO | Admitting: *Deleted

## 2020-10-25 ENCOUNTER — Other Ambulatory Visit: Payer: Self-pay | Admitting: *Deleted

## 2020-10-25 ENCOUNTER — Other Ambulatory Visit: Payer: Self-pay

## 2020-10-25 DIAGNOSIS — Z3480 Encounter for supervision of other normal pregnancy, unspecified trimester: Secondary | ICD-10-CM | POA: Insufficient documentation

## 2020-10-25 DIAGNOSIS — Z9889 Other specified postprocedural states: Secondary | ICD-10-CM

## 2020-10-25 DIAGNOSIS — Z3A13 13 weeks gestation of pregnancy: Secondary | ICD-10-CM | POA: Insufficient documentation

## 2020-10-25 DIAGNOSIS — Z6832 Body mass index (BMI) 32.0-32.9, adult: Secondary | ICD-10-CM

## 2020-11-10 ENCOUNTER — Other Ambulatory Visit: Payer: Self-pay

## 2020-11-10 ENCOUNTER — Encounter: Payer: Self-pay | Admitting: Family Medicine

## 2020-11-10 ENCOUNTER — Ambulatory Visit (INDEPENDENT_AMBULATORY_CARE_PROVIDER_SITE_OTHER): Payer: BC Managed Care – PPO | Admitting: Family Medicine

## 2020-11-10 VITALS — BP 112/60 | HR 97 | Wt 185.0 lb

## 2020-11-10 DIAGNOSIS — Z3009 Encounter for other general counseling and advice on contraception: Secondary | ICD-10-CM | POA: Insufficient documentation

## 2020-11-10 DIAGNOSIS — Z3480 Encounter for supervision of other normal pregnancy, unspecified trimester: Secondary | ICD-10-CM

## 2020-11-10 DIAGNOSIS — Z3A21 21 weeks gestation of pregnancy: Secondary | ICD-10-CM

## 2020-11-10 DIAGNOSIS — Z9889 Other specified postprocedural states: Secondary | ICD-10-CM

## 2020-11-10 NOTE — Progress Notes (Signed)
   Subjective:  Maria Davis is a 30 y.o. G2P1001 at [redacted]w[redacted]d being seen today for ongoing prenatal care.  She is currently monitored for the following issues for this low-risk pregnancy and has Supervision of other normal pregnancy, antepartum; History of loop electrical excision procedure (LEEP); and Alpha thalassemia silent carrier on their problem list.  Patient reports no complaints.  Contractions: Not present. Vag. Bleeding: None.  Movement: Present. Denies leaking of fluid.   The following portions of the patient's history were reviewed and updated as appropriate: allergies, current medications, past family history, past medical history, past social history, past surgical history and problem list. Problem list updated.  Objective:   Vitals:   11/10/20 0918  BP: 112/60  Pulse: 97  Weight: 185 lb (83.9 kg)    Fetal Status: Fetal Heart Rate (bpm): 156   Movement: Present     General:  Alert, oriented and cooperative. Patient is in no acute distress.  Skin: Skin is warm and dry. No rash noted.   Cardiovascular: Normal heart rate noted  Respiratory: Normal respiratory effort, no problems with respiration noted  Abdomen: Soft, gravid, appropriate for gestational age. Pain/Pressure: Absent     Pelvic: Vag. Bleeding: None     Cervical exam deferred        Extremities: Normal range of motion.  Edema: None  Mental Status: Normal mood and affect. Normal behavior. Normal judgment and thought content.   Urinalysis:      Assessment and Plan:  Pregnancy: G2P1001 at [redacted]w[redacted]d  1. [redacted] weeks gestation of pregnancy  2. Supervision of other normal pregnancy, antepartum BP and FHR normal Anatomy scan unremarkable, scheduled for repeat due to limited view of anatomy in a few weeks  3. History of loop electrical excision procedure (LEEP) On close review of chart appears patient was unable to tolerate office LEEP procedure in 01/2020, was scheduled for a surgery center 02/2020 but this was also  cancelled Today patient reports LEEP was never performed as she needed full COVID vaccination for the surgery center and then she became pregnant Discussed we will need to do the procedure after delivery, she is amenable PL updated  4. Unwanted fertility Sign papers at 28 weeks  Preterm labor symptoms and general obstetric precautions including but not limited to vaginal bleeding, contractions, leaking of fluid and fetal movement were reviewed in detail with the patient. Please refer to After Visit Summary for other counseling recommendations.  Return in 4 weeks (on 12/08/2020) for ob visit.   Venora Maples, MD

## 2020-11-10 NOTE — Patient Instructions (Signed)
 Contraception Choices Contraception, also called birth control, refers to methods or devices that prevent pregnancy. Hormonal methods Contraceptive implant A contraceptive implant is a thin, plastic tube that contains a hormone that prevents pregnancy. It is different from an intrauterine device (IUD). It is inserted into the upper part of the arm by a health care provider. Implants can be effective for up to 3 years. Progestin-only injections Progestin-only injections are injections of progestin, a synthetic form of the hormone progesterone. They are given every 3 months by a health care provider. Birth control pills Birth control pills are pills that contain hormones that prevent pregnancy. They must be taken once a day, preferably at the same time each day. A prescription is needed to use this method of contraception. Birth control patch The birth control patch contains hormones that prevent pregnancy. It is placed on the skin and must be changed once a week for three weeks and removed on the fourth week. A prescription is needed to use this method of contraception. Vaginal ring A vaginal ring contains hormones that prevent pregnancy. It is placed in the vagina for three weeks and removed on the fourth week. After that, the process is repeated with a new ring. A prescription is needed to use this method of contraception. Emergency contraceptive Emergency contraceptives prevent pregnancy after unprotected sex. They come in pill form and can be taken up to 5 days after sex. They work best the sooner they are taken after having sex. Most emergency contraceptives are available without a prescription. This method should not be used as your only form of birth control.   Barrier methods Female condom A female condom is a thin sheath that is worn over the penis during sex. Condoms keep sperm from going inside a woman's body. They can be used with a sperm-killing substance (spermicide) to increase their  effectiveness. They should be thrown away after one use. Female condom A female condom is a soft, loose-fitting sheath that is put into the vagina before sex. The condom keeps sperm from going inside a woman's body. They should be thrown away after one use. Diaphragm A diaphragm is a soft, dome-shaped barrier. It is inserted into the vagina before sex, along with a spermicide. The diaphragm blocks sperm from entering the uterus, and the spermicide kills sperm. A diaphragm should be left in the vagina for 6-8 hours after sex and removed within 24 hours. A diaphragm is prescribed and fitted by a health care provider. A diaphragm should be replaced every 1-2 years, after giving birth, after gaining more than 15 lb (6.8 kg), and after pelvic surgery. Cervical cap A cervical cap is a round, soft latex or plastic cup that fits over the cervix. It is inserted into the vagina before sex, along with spermicide. It blocks sperm from entering the uterus. The cap should be left in place for 6-8 hours after sex and removed within 48 hours. A cervical cap must be prescribed and fitted by a health care provider. It should be replaced every 2 years. Sponge A sponge is a soft, circular piece of polyurethane foam with spermicide in it. The sponge helps block sperm from entering the uterus, and the spermicide kills sperm. To use it, you make it wet and then insert it into the vagina. It should be inserted before sex, left in for at least 6 hours after sex, and removed and thrown away within 30 hours. Spermicides Spermicides are chemicals that kill or block sperm from entering the   cervix and uterus. They can come as a cream, jelly, suppository, foam, or tablet. A spermicide should be inserted into the vagina with an applicator at least 10-15 minutes before sex to allow time for it to work. The process must be repeated every time you have sex. Spermicides do not require a prescription.   Intrauterine  contraception Intrauterine device (IUD) An IUD is a T-shaped device that is put in a woman's uterus. There are two types:  Hormone IUD.This type contains progestin, a synthetic form of the hormone progesterone. This type can stay in place for 3-5 years.  Copper IUD.This type is wrapped in copper wire. It can stay in place for 10 years. Permanent methods of contraception Female tubal ligation In this method, a woman's fallopian tubes are sealed, tied, or blocked during surgery to prevent eggs from traveling to the uterus. Hysteroscopic sterilization In this method, a small, flexible insert is placed into each fallopian tube. The inserts cause scar tissue to form in the fallopian tubes and block them, so sperm cannot reach an egg. The procedure takes about 3 months to be effective. Another form of birth control must be used during those 3 months. Female sterilization This is a procedure to tie off the tubes that carry sperm (vasectomy). After the procedure, the man can still ejaculate fluid (semen). Another form of birth control must be used for 3 months after the procedure. Natural planning methods Natural family planning In this method, a couple does not have sex on days when the woman could become pregnant. Calendar method In this method, the woman keeps track of the length of each menstrual cycle, identifies the days when pregnancy can happen, and does not have sex on those days. Ovulation method In this method, a couple avoids sex during ovulation. Symptothermal method This method involves not having sex during ovulation. The woman typically checks for ovulation by watching changes in her temperature and in the consistency of cervical mucus. Post-ovulation method In this method, a couple waits to have sex until after ovulation. Where to find more information  Centers for Disease Control and Prevention: www.cdc.gov Summary  Contraception, also called birth control, refers to methods or  devices that prevent pregnancy.  Hormonal methods of contraception include implants, injections, pills, patches, vaginal rings, and emergency contraceptives.  Barrier methods of contraception can include female condoms, female condoms, diaphragms, cervical caps, sponges, and spermicides.  There are two types of IUDs (intrauterine devices). An IUD can be put in a woman's uterus to prevent pregnancy for 3-5 years.  Permanent sterilization can be done through a procedure for males and females. Natural family planning methods involve nothaving sex on days when the woman could become pregnant. This information is not intended to replace advice given to you by your health care provider. Make sure you discuss any questions you have with your health care provider. Document Revised: 11/10/2019 Document Reviewed: 11/10/2019 Elsevier Patient Education  2021 [employer] Inc.   Breastfeeding  Choosing to breastfeed is one of the best decisions you can make for yourself and your baby. A change in hormones during pregnancy causes your breasts to make breast milk in your milk-producing glands. Hormones prevent breast milk from being released before your baby is born. They also prompt milk flow after birth. Once breastfeeding has begun, thoughts of your baby, as well as his or her sucking or crying, can stimulate the release of milk from your milk-producing glands. Benefits of breastfeeding Research shows that breastfeeding offers many health benefits   for infants and mothers. It also offers a cost-free and convenient way to feed your baby. For your baby  Your first milk (colostrum) helps your baby's digestive system to function better.  Special cells in your milk (antibodies) help your baby to fight off infections.  Breastfed babies are less likely to develop asthma, allergies, obesity, or type 2 diabetes. They are also at lower risk for sudden infant death syndrome (SIDS).  Nutrients in breast milk are better  able to meet your baby's needs compared to infant formula.  Breast milk improves your baby's brain development. For you  Breastfeeding helps to create a very special bond between you and your baby.  Breastfeeding is convenient. Breast milk costs nothing and is always available at the correct temperature.  Breastfeeding helps to burn calories. It helps you to lose the weight that you gained during pregnancy.  Breastfeeding makes your uterus return faster to its size before pregnancy. It also slows bleeding (lochia) after you give birth.  Breastfeeding helps to lower your risk of developing type 2 diabetes, osteoporosis, rheumatoid arthritis, cardiovascular disease, and breast, ovarian, uterine, and endometrial cancer later in life. Breastfeeding basics Starting breastfeeding  Find a comfortable place to sit or lie down, with your neck and back well-supported.  Place a pillow or a rolled-up blanket under your baby to bring him or her to the level of your breast (if you are seated). Nursing pillows are specially designed to help support your arms and your baby while you breastfeed.  Make sure that your baby's tummy (abdomen) is facing your abdomen.  Gently massage your breast. With your fingertips, massage from the outer edges of your breast inward toward the nipple. This encourages milk flow. If your milk flows slowly, you may need to continue this action during the feeding.  Support your breast with 4 fingers underneath and your thumb above your nipple (make the letter "C" with your hand). Make sure your fingers are well away from your nipple and your baby's mouth.  Stroke your baby's lips gently with your finger or nipple.  When your baby's mouth is open wide enough, quickly bring your baby to your breast, placing your entire nipple and as much of the areola as possible into your baby's mouth. The areola is the colored area around your nipple. ? More areola should be visible above your  baby's upper lip than below the lower lip. ? Your baby's lips should be opened and extended outward (flanged) to ensure an adequate, comfortable latch. ? Your baby's tongue should be between his or her lower gum and your breast.  Make sure that your baby's mouth is correctly positioned around your nipple (latched). Your baby's lips should create a seal on your breast and be turned out (everted).  It is common for your baby to suck about 2-3 minutes in order to start the flow of breast milk. Latching Teaching your baby how to latch onto your breast properly is very important. An improper latch can cause nipple pain, decreased milk supply, and poor weight gain in your baby. Also, if your baby is not latched onto your nipple properly, he or she may swallow some air during feeding. This can make your baby fussy. Burping your baby when you switch breasts during the feeding can help to get rid of the air. However, teaching your baby to latch on properly is still the best way to prevent fussiness from swallowing air while breastfeeding. Signs that your baby has successfully latched onto   your nipple  Silent tugging or silent sucking, without causing you pain. Infant's lips should be extended outward (flanged).  Swallowing heard between every 3-4 sucks once your milk has started to flow (after your let-down milk reflex occurs).  Muscle movement above and in front of his or her ears while sucking. Signs that your baby has not successfully latched onto your nipple  Sucking sounds or smacking sounds from your baby while breastfeeding.  Nipple pain. If you think your baby has not latched on correctly, slip your finger into the corner of your baby's mouth to break the suction and place it between your baby's gums. Attempt to start breastfeeding again. Signs of successful breastfeeding Signs from your baby  Your baby will gradually decrease the number of sucks or will completely stop sucking.  Your baby  will fall asleep.  Your baby's body will relax.  Your baby will retain a small amount of milk in his or her mouth.  Your baby will let go of your breast by himself or herself. Signs from you  Breasts that have increased in firmness, weight, and size 1-3 hours after feeding.  Breasts that are softer immediately after breastfeeding.  Increased milk volume, as well as a change in milk consistency and color by the fifth day of breastfeeding.  Nipples that are not sore, cracked, or bleeding. Signs that your baby is getting enough milk  Wetting at least 1-2 diapers during the first 24 hours after birth.  Wetting at least 5-6 diapers every 24 hours for the first week after birth. The urine should be clear or pale yellow by the age of 5 days.  Wetting 6-8 diapers every 24 hours as your baby continues to grow and develop.  At least 3 stools in a 24-hour period by the age of 5 days. The stool should be soft and yellow.  At least 3 stools in a 24-hour period by the age of 7 days. The stool should be seedy and yellow.  No loss of weight greater than 10% of birth weight during the first 3 days of life.  Average weight gain of 4-7 oz (113-198 g) per week after the age of 4 days.  Consistent daily weight gain by the age of 5 days, without weight loss after the age of 2 weeks. After a feeding, your baby may spit up a small amount of milk. This is normal. Breastfeeding frequency and duration Frequent feeding will help you make more milk and can prevent sore nipples and extremely full breasts (breast engorgement). Breastfeed when you feel the need to reduce the fullness of your breasts or when your baby shows signs of hunger. This is called "breastfeeding on demand." Signs that your baby is hungry include:  Increased alertness, activity, or restlessness.  Movement of the head from side to side.  Opening of the mouth when the corner of the mouth or cheek is stroked (rooting).  Increased  sucking sounds, smacking lips, cooing, sighing, or squeaking.  Hand-to-mouth movements and sucking on fingers or hands.  Fussing or crying. Avoid introducing a pacifier to your baby in the first 4-6 weeks after your baby is born. After this time, you may choose to use a pacifier. Research has shown that pacifier use during the first year of a baby's life decreases the risk of sudden infant death syndrome (SIDS). Allow your baby to feed on each breast as long as he or she wants. When your baby unlatches or falls asleep while feeding from the   first breast, offer the second breast. Because newborns are often sleepy in the first few weeks of life, you may need to awaken your baby to get him or her to feed. Breastfeeding times will vary from baby to baby. However, the following rules can serve as a guide to help you make sure that your baby is properly fed:  Newborns (babies 4 weeks of age or younger) may breastfeed every 1-3 hours.  Newborns should not go without breastfeeding for longer than 3 hours during the day or 5 hours during the night.  You should breastfeed your baby a minimum of 8 times in a 24-hour period. Breast milk pumping Pumping and storing breast milk allows you to make sure that your baby is exclusively fed your breast milk, even at times when you are unable to breastfeed. This is especially important if you go back to work while you are still breastfeeding, or if you are not able to be present during feedings. Your lactation consultant can help you find a method of pumping that works best for you and give you guidelines about how long it is safe to store breast milk.      Caring for your breasts while you breastfeed Nipples can become dry, cracked, and sore while breastfeeding. The following recommendations can help keep your breasts moisturized and healthy:  Avoid using soap on your nipples.  Wear a supportive bra designed especially for nursing. Avoid wearing underwire-style  bras or extremely tight bras (sports bras).  Air-dry your nipples for 3-4 minutes after each feeding.  Use only cotton bra pads to absorb leaked breast milk. Leaking of breast milk between feedings is normal.  Use lanolin on your nipples after breastfeeding. Lanolin helps to maintain your skin's normal moisture barrier. Pure lanolin is not harmful (not toxic) to your baby. You may also hand express a few drops of breast milk and gently massage that milk into your nipples and allow the milk to air-dry. In the first few weeks after giving birth, some women experience breast engorgement. Engorgement can make your breasts feel heavy, warm, and tender to the touch. Engorgement peaks within 3-5 days after you give birth. The following recommendations can help to ease engorgement:  Completely empty your breasts while breastfeeding or pumping. You may want to start by applying warm, moist heat (in the shower or with warm, water-soaked hand towels) just before feeding or pumping. This increases circulation and helps the milk flow. If your baby does not completely empty your breasts while breastfeeding, pump any extra milk after he or she is finished.  Apply ice packs to your breasts immediately after breastfeeding or pumping, unless this is too uncomfortable for you. To do this: ? Put ice in a plastic bag. ? Place a towel between your skin and the bag. ? Leave the ice on for 20 minutes, 2-3 times a day.  Make sure that your baby is latched on and positioned properly while breastfeeding. If engorgement persists after 48 hours of following these recommendations, contact your health care provider or a lactation consultant. Overall health care recommendations while breastfeeding  Eat 3 healthy meals and 3 snacks every day. Well-nourished mothers who are breastfeeding need an additional 450-500 calories a day. You can meet this requirement by increasing the amount of a balanced diet that you eat.  Drink  enough water to keep your urine pale yellow or clear.  Rest often, relax, and continue to take your prenatal vitamins to prevent fatigue, stress, and low   vitamin and mineral levels in your body (nutrient deficiencies).  Do not use any products that contain nicotine or tobacco, such as cigarettes and e-cigarettes. Your baby may be harmed by chemicals from cigarettes that pass into breast milk and exposure to secondhand smoke. If you need help quitting, ask your health care provider.  Avoid alcohol.  Do not use illegal drugs or marijuana.  Talk with your health care provider before taking any medicines. These include over-the-counter and prescription medicines as well as vitamins and herbal supplements. Some medicines that may be harmful to your baby can pass through breast milk.  It is possible to become pregnant while breastfeeding. If birth control is desired, ask your health care provider about options that will be safe while breastfeeding your baby. Where to find more information: [employer]: www.llli.org Contact a health care provider if:  You feel like you want to stop breastfeeding or have become frustrated with breastfeeding.  Your nipples are cracked or bleeding.  Your breasts are red, tender, or warm.  You have: ? Painful breasts or nipples. ? A swollen area on either breast. ? A fever or chills. ? Nausea or vomiting. ? Drainage other than breast milk from your nipples.  Your breasts do not become full before feedings by the fifth day after you give birth.  You feel sad and depressed.  Your baby is: ? Too sleepy to eat well. ? Having trouble sleeping. ? More than 1 week old and wetting fewer than 6 diapers in a 24-hour period. ? Not gaining weight by 5 days of age.  Your baby has fewer than 3 stools in a 24-hour period.  Your baby's skin or the white parts of his or her eyes become yellow. Get help right away if:  Your baby is overly tired  (lethargic) and does not want to wake up and feed.  Your baby develops an unexplained fever. Summary  Breastfeeding offers many health benefits for infant and mothers.  Try to breastfeed your infant when he or she shows early signs of hunger.  Gently tickle or stroke your baby's lips with your finger or nipple to allow the baby to open his or her mouth. Bring the baby to your breast. Make sure that much of the areola is in your baby's mouth. Offer one side and burp the baby before you offer the other side.  Talk with your health care provider or lactation consultant if you have questions or you face problems as you breastfeed. This information is not intended to replace advice given to you by your health care provider. Make sure you discuss any questions you have with your health care provider. Document Revised: 08/30/2017 Document Reviewed: 07/07/2016 Elsevier Patient Education  2021 Elsevier Inc.  

## 2020-11-23 ENCOUNTER — Ambulatory Visit: Payer: BC Managed Care – PPO | Admitting: *Deleted

## 2020-11-23 ENCOUNTER — Other Ambulatory Visit: Payer: Self-pay

## 2020-11-23 ENCOUNTER — Ambulatory Visit: Payer: BC Managed Care – PPO | Attending: Obstetrics

## 2020-11-23 ENCOUNTER — Encounter: Payer: Self-pay | Admitting: *Deleted

## 2020-11-23 DIAGNOSIS — Z9889 Other specified postprocedural states: Secondary | ICD-10-CM | POA: Diagnosis present

## 2020-11-23 DIAGNOSIS — Z362 Encounter for other antenatal screening follow-up: Secondary | ICD-10-CM | POA: Diagnosis not present

## 2020-11-23 DIAGNOSIS — Z3480 Encounter for supervision of other normal pregnancy, unspecified trimester: Secondary | ICD-10-CM

## 2020-11-23 DIAGNOSIS — O3442 Maternal care for other abnormalities of cervix, second trimester: Secondary | ICD-10-CM

## 2020-11-23 DIAGNOSIS — Z148 Genetic carrier of other disease: Secondary | ICD-10-CM

## 2020-11-23 DIAGNOSIS — Z3A23 23 weeks gestation of pregnancy: Secondary | ICD-10-CM

## 2020-11-23 DIAGNOSIS — Z6832 Body mass index (BMI) 32.0-32.9, adult: Secondary | ICD-10-CM | POA: Insufficient documentation

## 2020-11-23 DIAGNOSIS — O99212 Obesity complicating pregnancy, second trimester: Secondary | ICD-10-CM | POA: Diagnosis not present

## 2020-11-23 DIAGNOSIS — E669 Obesity, unspecified: Secondary | ICD-10-CM

## 2020-11-23 DIAGNOSIS — N879 Dysplasia of cervix uteri, unspecified: Secondary | ICD-10-CM

## 2020-11-24 ENCOUNTER — Other Ambulatory Visit: Payer: Self-pay | Admitting: *Deleted

## 2020-11-24 DIAGNOSIS — Z3689 Encounter for other specified antenatal screening: Secondary | ICD-10-CM

## 2020-12-08 ENCOUNTER — Other Ambulatory Visit: Payer: Self-pay

## 2020-12-08 ENCOUNTER — Ambulatory Visit (INDEPENDENT_AMBULATORY_CARE_PROVIDER_SITE_OTHER): Payer: BC Managed Care – PPO | Admitting: Obstetrics & Gynecology

## 2020-12-08 ENCOUNTER — Encounter: Payer: Self-pay | Admitting: Obstetrics & Gynecology

## 2020-12-08 VITALS — BP 112/60 | HR 89 | Wt 185.2 lb

## 2020-12-08 DIAGNOSIS — Z3009 Encounter for other general counseling and advice on contraception: Secondary | ICD-10-CM

## 2020-12-08 DIAGNOSIS — Z3480 Encounter for supervision of other normal pregnancy, unspecified trimester: Secondary | ICD-10-CM

## 2020-12-08 DIAGNOSIS — Z3A25 25 weeks gestation of pregnancy: Secondary | ICD-10-CM

## 2020-12-08 DIAGNOSIS — Z9889 Other specified postprocedural states: Secondary | ICD-10-CM

## 2020-12-08 DIAGNOSIS — D563 Thalassemia minor: Secondary | ICD-10-CM

## 2020-12-08 NOTE — Progress Notes (Signed)
   PRENATAL VISIT NOTE  Subjective:  Maria Davis is a 30 y.o. G2P1001 at [redacted]w[redacted]d being seen today for ongoing prenatal care.  She is currently monitored for the following issues for this low-risk pregnancy and has Supervision of other normal pregnancy, antepartum; History of loop electrical excision procedure (LEEP); Alpha thalassemia silent carrier; and Unwanted fertility on their problem list.  Patient reports no complaints.  Contractions: Not present. Vag. Bleeding: None.  Movement: Present. Denies leaking of fluid.   The following portions of the patient's history were reviewed and updated as appropriate: allergies, current medications, past family history, past medical history, past social history, past surgical history and problem list.   Objective:   Vitals:   12/08/20 0932  BP: 112/60  Pulse: 89  Weight: 185 lb 4 oz (84 kg)    Fetal Status: Fetal Heart Rate (bpm): 143   Movement: Present     General:  Alert, oriented and cooperative. Patient is in no acute distress.  Skin: Skin is warm and dry. No rash noted.   Cardiovascular: Normal heart rate noted  Respiratory: Normal respiratory effort, no problems with respiration noted  Abdomen: Soft, gravid, appropriate for gestational age.  Pain/Pressure: Absent     Pelvic: Cervical exam deferred        Extremities: Normal range of motion.  Edema: None  Mental Status: Normal mood and affect. Normal behavior. Normal judgment and thought content.   Assessment and Plan:  Pregnancy: G2P1001 at [redacted]w[redacted]d 1. [redacted] weeks gestation of pregnancy FH and FHR WNL  2. Supervision of other normal pregnancy, antepartum Needs 2 hour GTT on next visit. Pt notified.   3. History of loop electrical excision procedure (LEEP)  4. Unwanted fertility Desires PP BTL   5. Alpha thalassemia silent carrier  Preterm labor symptoms and general obstetric precautions including but not limited to vaginal bleeding, contractions, leaking of fluid and fetal  movement were reviewed in detail with the patient. Please refer to After Visit Summary for other counseling recommendations.   Return in about 4 weeks (around 01/05/2021) for in person.  Future Appointments  Date Time Provider Department Center  01/24/2021  2:30 PM [facility] NURSE [facility] [place]  01/24/2021  2:45 PM [facility]    Willodean Rosenthal, MD

## 2020-12-08 NOTE — Patient Instructions (Signed)

## 2020-12-31 ENCOUNTER — Other Ambulatory Visit: Payer: Self-pay

## 2020-12-31 ENCOUNTER — Ambulatory Visit (INDEPENDENT_AMBULATORY_CARE_PROVIDER_SITE_OTHER): Payer: BC Managed Care – PPO | Admitting: Obstetrics & Gynecology

## 2020-12-31 VITALS — BP 112/60 | HR 91 | Wt 185.0 lb

## 2020-12-31 DIAGNOSIS — O26893 Other specified pregnancy related conditions, third trimester: Secondary | ICD-10-CM

## 2020-12-31 DIAGNOSIS — Z9889 Other specified postprocedural states: Secondary | ICD-10-CM

## 2020-12-31 DIAGNOSIS — Z3A28 28 weeks gestation of pregnancy: Secondary | ICD-10-CM

## 2020-12-31 DIAGNOSIS — Z23 Encounter for immunization: Secondary | ICD-10-CM | POA: Diagnosis not present

## 2020-12-31 DIAGNOSIS — Z3480 Encounter for supervision of other normal pregnancy, unspecified trimester: Secondary | ICD-10-CM

## 2020-12-31 DIAGNOSIS — D563 Thalassemia minor: Secondary | ICD-10-CM

## 2020-12-31 DIAGNOSIS — Z3009 Encounter for other general counseling and advice on contraception: Secondary | ICD-10-CM

## 2020-12-31 NOTE — Patient Instructions (Signed)
.  [facility] 

## 2020-12-31 NOTE — Progress Notes (Signed)
   PRENATAL VISIT NOTE  Subjective:  Maria Davis is a 30 y.o. G2P1001 at [redacted]w[redacted]d being seen today for ongoing prenatal care.  She is currently monitored for the following issues for this low-risk pregnancy and has Supervision of other normal pregnancy, antepartum; History of loop electrical excision procedure (LEEP); Alpha thalassemia silent carrier; and Unwanted fertility on their problem list.  Patient reports no complaints.  Contractions: Not present. Vag. Bleeding: None.  Movement: Present. Denies leaking of fluid.   The following portions of the patient's history were reviewed and updated as appropriate: allergies, current medications, past family history, past medical history, past social history, past surgical history and problem list.   Objective:   Vitals:   12/31/20 0846  BP: 112/60  Pulse: 91  Weight: 185 lb (83.9 kg)    Fetal Status:     Movement: Present     General:  Alert, oriented and cooperative. Patient is in no acute distress.  Skin: Skin is warm and dry. No rash noted.   Cardiovascular: Normal heart rate noted  Respiratory: Normal respiratory effort, no problems with respiration noted  Abdomen: Soft, gravid, appropriate for gestational age.  Pain/Pressure: Absent     Pelvic: Cervical exam deferred        Extremities: Normal range of motion.  Edema: None  Mental Status: Normal mood and affect. Normal behavior. Normal judgment and thought content.   Assessment and Plan:  Pregnancy: G2P1001 at [redacted]w[redacted]d 1. [redacted] weeks gestation of pregnancy  - CBC - Glucose Tolerance, 2 Hours w/1 Hour - HIV Antibody (routine testing w rflx) - RPR - Tdap vaccine greater than or equal to 7yo IM  2. Supervision of other normal pregnancy, antepartum FH and FHR WNL  3. History of loop electrical excision procedure (LEEP)  4. Unwanted fertility Desires BTL. Title XIX not needed.     5. Alpha thalassemia silent carrier   Preterm labor symptoms and general obstetric precautions  including but not limited to vaginal bleeding, contractions, leaking of fluid and fetal movement were reviewed in detail with the patient. Please refer to After Visit Summary for other counseling recommendations.   No follow-ups on file.  Future Appointments  Date Time Provider Department Center  01/24/2021  2:30 PM [facility] NURSE [facility]  01/24/2021  2:45 PM [facility] US4 [facility]    Willodean Rosenthal, MD

## 2021-01-01 LAB — CBC
Hematocrit: 31.4 % — ABNORMAL LOW (ref 34.0–46.6)
Hemoglobin: 10.3 g/dL — ABNORMAL LOW (ref 11.1–15.9)
MCH: 26.3 pg — ABNORMAL LOW (ref 26.6–33.0)
MCHC: 32.8 g/dL (ref 31.5–35.7)
MCV: 80 fL (ref 79–97)
Platelets: 270 10*3/uL (ref 150–450)
RBC: 3.92 x10E6/uL (ref 3.77–5.28)
RDW: 14 % (ref 11.7–15.4)
WBC: 6.9 10*3/uL (ref 3.4–10.8)

## 2021-01-01 LAB — RPR: RPR Ser Ql: NONREACTIVE

## 2021-01-01 LAB — GLUCOSE TOLERANCE, 2 HOURS W/ 1HR
Glucose, 1 hour: 127 mg/dL (ref 65–179)
Glucose, 2 hour: 82 mg/dL (ref 65–152)
Glucose, Fasting: 74 mg/dL (ref 65–91)

## 2021-01-01 LAB — HIV ANTIBODY (ROUTINE TESTING W REFLEX): HIV Screen 4th Generation wRfx: NONREACTIVE

## 2021-01-09 ENCOUNTER — Encounter: Payer: Self-pay | Admitting: Obstetrics & Gynecology

## 2021-01-09 DIAGNOSIS — O99019 Anemia complicating pregnancy, unspecified trimester: Secondary | ICD-10-CM | POA: Insufficient documentation

## 2021-01-21 ENCOUNTER — Other Ambulatory Visit: Payer: Self-pay

## 2021-01-21 ENCOUNTER — Ambulatory Visit (INDEPENDENT_AMBULATORY_CARE_PROVIDER_SITE_OTHER): Payer: BC Managed Care – PPO | Admitting: Family Medicine

## 2021-01-21 VITALS — BP 117/63 | HR 106 | Wt 186.0 lb

## 2021-01-21 DIAGNOSIS — Z9889 Other specified postprocedural states: Secondary | ICD-10-CM

## 2021-01-21 DIAGNOSIS — Z3480 Encounter for supervision of other normal pregnancy, unspecified trimester: Secondary | ICD-10-CM

## 2021-01-21 DIAGNOSIS — Z3A31 31 weeks gestation of pregnancy: Secondary | ICD-10-CM

## 2021-01-21 NOTE — Progress Notes (Signed)
   PRENATAL VISIT NOTE  Subjective:  Maria Davis is a 30 y.o. G2P1001 at [redacted]w[redacted]d being seen today for ongoing prenatal care.  She is currently monitored for the following issues for this low-risk pregnancy and has Supervision of other normal pregnancy, antepartum; History of loop electrical excision procedure (LEEP); Alpha thalassemia silent carrier; Unwanted fertility; and Anemia, antepartum on their problem list.  Patient reports no complaints.  Contractions: Not present. Vag. Bleeding: None.  Movement: Present. Denies leaking of fluid.   The following portions of the patient's history were reviewed and updated as appropriate: allergies, current medications, past family history, past medical history, past social history, past surgical history and problem list.   Objective:   Vitals:   01/21/21 1110  BP: 117/63  Pulse: (!) 106  Weight: 186 lb (84.4 kg)    Fetal Status: Fetal Heart Rate (bpm): 143 Fundal Height: 31 cm Movement: Present     General:  Alert, oriented and cooperative. Patient is in no acute distress.  Skin: Skin is warm and dry. No rash noted.   Cardiovascular: Normal heart rate noted  Respiratory: Normal respiratory effort, no problems with respiration noted  Abdomen: Soft, gravid, appropriate for gestational age.  Pain/Pressure: Present     Pelvic: Cervical exam deferred        Extremities: Normal range of motion.  Edema: None  Mental Status: Normal mood and affect. Normal behavior. Normal judgment and thought content.   Assessment and Plan:  Pregnancy: G2P1001 at [redacted]w[redacted]d 1. [redacted] weeks gestation of pregnancy   2. Supervision of other normal pregnancy, antepartum Continue routine prenatal care.   3. History of loop electrical excision procedure (LEEP) Did not have--may need post delivery  Preterm labor symptoms and general obstetric precautions including but not limited to vaginal bleeding, contractions, leaking of fluid and fetal movement were reviewed in detail  with the patient. Please refer to After Visit Summary for other counseling recommendations.   Return in 2 weeks (on 02/04/2021).  Future Appointments  Date Time Provider Department Center  01/24/2021  2:30 PM [facility] NURSE [facility]  01/24/2021  2:45 PM [facility] US4 [facility]  02/04/2021 10:55 AM Levie Heritage, DO [facility] None  02/17/2021 10:55 AM Levie Heritage, DO [facility] None  02/24/2021  9:55 AM Levie Heritage, DO [facility] None  03/03/2021 10:55 AM Reva Bores, MD [facility] None  03/10/2021 10:15 AM Levie Heritage, DO [facility] None  03/17/2021 10:15 AM Adrian Blackwater, Rhona Raider, DO [facility] None    Reva Bores, MD

## 2021-01-24 ENCOUNTER — Ambulatory Visit: Payer: BC Managed Care – PPO | Admitting: *Deleted

## 2021-01-24 ENCOUNTER — Ambulatory Visit: Payer: BC Managed Care – PPO | Attending: Maternal & Fetal Medicine

## 2021-01-24 ENCOUNTER — Encounter: Payer: Self-pay | Admitting: *Deleted

## 2021-01-24 ENCOUNTER — Other Ambulatory Visit: Payer: Self-pay

## 2021-01-24 VITALS — BP 112/57 | HR 92

## 2021-01-24 DIAGNOSIS — Z3480 Encounter for supervision of other normal pregnancy, unspecified trimester: Secondary | ICD-10-CM | POA: Diagnosis present

## 2021-01-24 DIAGNOSIS — Z9889 Other specified postprocedural states: Secondary | ICD-10-CM | POA: Diagnosis present

## 2021-01-24 DIAGNOSIS — O99019 Anemia complicating pregnancy, unspecified trimester: Secondary | ICD-10-CM | POA: Insufficient documentation

## 2021-01-24 DIAGNOSIS — Z3689 Encounter for other specified antenatal screening: Secondary | ICD-10-CM | POA: Diagnosis present

## 2021-02-04 ENCOUNTER — Other Ambulatory Visit: Payer: Self-pay

## 2021-02-04 ENCOUNTER — Ambulatory Visit (INDEPENDENT_AMBULATORY_CARE_PROVIDER_SITE_OTHER): Payer: Medicaid Other | Admitting: Family Medicine

## 2021-02-04 VITALS — BP 117/61 | HR 98 | Wt 187.0 lb

## 2021-02-04 DIAGNOSIS — Z3009 Encounter for other general counseling and advice on contraception: Secondary | ICD-10-CM

## 2021-02-04 DIAGNOSIS — D069 Carcinoma in situ of cervix, unspecified: Secondary | ICD-10-CM

## 2021-02-04 DIAGNOSIS — Z3A33 33 weeks gestation of pregnancy: Secondary | ICD-10-CM

## 2021-02-04 DIAGNOSIS — Z3480 Encounter for supervision of other normal pregnancy, unspecified trimester: Secondary | ICD-10-CM

## 2021-02-04 DIAGNOSIS — D563 Thalassemia minor: Secondary | ICD-10-CM

## 2021-02-04 NOTE — Progress Notes (Signed)
   PRENATAL VISIT NOTE  Subjective:  Maria Davis is a 30 y.o. G2P1001 at [redacted]w[redacted]d being seen today for ongoing prenatal care.  She is currently monitored for the following issues for this low-risk pregnancy and has Supervision of other normal pregnancy, antepartum; CIN III (cervical intraepithelial neoplasia grade III) with severe dysplasia; Alpha thalassemia silent carrier; Unwanted fertility; and Anemia, antepartum on their problem list.  Patient reports no complaints.  Contractions: Irritability. Vag. Bleeding: None.  Movement: Present. Denies leaking of fluid.   The following portions of the patient's history were reviewed and updated as appropriate: allergies, current medications, past family history, past medical history, past social history, past surgical history and problem list.   Objective:   Vitals:   02/04/21 1102  BP: 117/61  Pulse: 98  Weight: 187 lb (84.8 kg)    Fetal Status: Fetal Heart Rate (bpm): 144   Movement: Present     General:  Alert, oriented and cooperative. Patient is in no acute distress.  Skin: Skin is warm and dry. No rash noted.   Cardiovascular: Normal heart rate noted  Respiratory: Normal respiratory effort, no problems with respiration noted  Abdomen: Soft, gravid, appropriate for gestational age.  Pain/Pressure: Present     Pelvic: Cervical exam deferred        Extremities: Normal range of motion.  Edema: None  Mental Status: Normal mood and affect. Normal behavior. Normal judgment and thought content.   Assessment and Plan:  Pregnancy: G2P1001 at [redacted]w[redacted]d 1. [redacted] weeks gestation of pregnancy  2. Supervision of other normal pregnancy, antepartum FHT and FH normal  3. CIN III (cervical intraepithelial neoplasia grade III) with severe dysplasia Had colpo 08/2019 with CIN2-3 on biopsy, CIN 1 on ECC. Was supposed to have LEEP, but had too much anxiety. Will need colpo postpartum as it has been > 1year since last testing. Would premedicate with Ativan or  Valium prior to procedures.  4. Unwanted fertility PP BTL  5. Alpha thalassemia silent carrier    Preterm labor symptoms and general obstetric precautions including but not limited to vaginal bleeding, contractions, leaking of fluid and fetal movement were reviewed in detail with the patient. Please refer to After Visit Summary for other counseling recommendations.   No follow-ups on file.  Future Appointments  Date Time Provider Department Center  02/17/2021 10:55 AM Levie Heritage, DO [facility] None  02/24/2021  9:55 AM Levie Heritage, DO [facility] None  03/03/2021 10:55 AM Reva Bores, MD [facility] None  03/10/2021 10:15 AM Levie Heritage, DO [facility] None  03/17/2021 10:15 AM Levie Heritage, DO [facility] None    Levie Heritage, DO

## 2021-02-17 ENCOUNTER — Other Ambulatory Visit: Payer: Self-pay

## 2021-02-17 ENCOUNTER — Ambulatory Visit (INDEPENDENT_AMBULATORY_CARE_PROVIDER_SITE_OTHER): Payer: Medicaid Other | Admitting: Family Medicine

## 2021-02-17 VITALS — BP 111/61 | HR 78 | Temp 98.0°F | Wt 183.0 lb

## 2021-02-17 DIAGNOSIS — Z3480 Encounter for supervision of other normal pregnancy, unspecified trimester: Secondary | ICD-10-CM

## 2021-02-17 DIAGNOSIS — D563 Thalassemia minor: Secondary | ICD-10-CM

## 2021-02-17 DIAGNOSIS — D069 Carcinoma in situ of cervix, unspecified: Secondary | ICD-10-CM

## 2021-02-17 DIAGNOSIS — O99019 Anemia complicating pregnancy, unspecified trimester: Secondary | ICD-10-CM

## 2021-02-17 NOTE — Progress Notes (Signed)
   PRENATAL VISIT NOTE  Subjective:  Maria Davis is a 30 y.o. G2P1001 at [redacted]w[redacted]d being seen today for ongoing prenatal care.  She is currently monitored for the following issues for this low-risk pregnancy and has Supervision of other normal pregnancy, antepartum; CIN III (cervical intraepithelial neoplasia grade III) with severe dysplasia; Alpha thalassemia silent carrier; Unwanted fertility; and Anemia, antepartum on their problem list.  Patient reports no complaints.  Contractions: Not present. Vag. Bleeding: None.  Movement: Present. Denies leaking of fluid.   The following portions of the patient's history were reviewed and updated as appropriate: allergies, current medications, past family history, past medical history, past social history, past surgical history and problem list.   Objective:   Vitals:   02/17/21 1045  BP: 111/61  Pulse: 78  Temp: 98 F (36.7 C)  Weight: 183 lb (83 kg)    Fetal Status: Fetal Heart Rate (bpm): 143 Fundal Height: 35 cm Movement: Present  Presentation: Vertex  General:  Alert, oriented and cooperative. Patient is in no acute distress.  Skin: Skin is warm and dry. No rash noted.   Cardiovascular: Normal heart rate noted  Respiratory: Normal respiratory effort, no problems with respiration noted  Abdomen: Soft, gravid, appropriate for gestational age.  Pain/Pressure: Absent     Pelvic: Cervical exam deferred        Extremities: Normal range of motion.  Edema: None  Mental Status: Normal mood and affect. Normal behavior. Normal judgment and thought content.   Assessment and Plan:  Pregnancy: G2P1001 at [redacted]w[redacted]d 1. Supervision of other normal pregnancy, antepartum FHT and FH normal  2. CIN III (cervical intraepithelial neoplasia grade III) with severe dysplasia Colpo postpartum  3. Alpha thalassemia silent carrier   4. Anemia, antepartum On iron supplement  Preterm labor symptoms and general obstetric precautions including but not limited to  vaginal bleeding, contractions, leaking of fluid and fetal movement were reviewed in detail with the patient. Please refer to After Visit Summary for other counseling recommendations.   No follow-ups on file.  Future Appointments  Date Time Provider Department Center  02/24/2021  9:55 AM Levie Heritage, DO [facility] None  03/03/2021 10:55 AM Reva Bores, MD [facility] None  03/10/2021 10:15 AM Levie Heritage, DO [facility] None  03/17/2021 10:15 AM Levie Heritage, DO [facility] None    Levie Heritage, DO

## 2021-02-24 ENCOUNTER — Ambulatory Visit (INDEPENDENT_AMBULATORY_CARE_PROVIDER_SITE_OTHER): Payer: Medicaid Other | Admitting: Family Medicine

## 2021-02-24 ENCOUNTER — Other Ambulatory Visit: Payer: Self-pay

## 2021-02-24 ENCOUNTER — Other Ambulatory Visit (HOSPITAL_COMMUNITY)
Admission: RE | Admit: 2021-02-24 | Discharge: 2021-02-24 | Disposition: A | Discharge status: Home / Self Care | Payer: Medicaid Other | Source: Ambulatory Visit | Attending: Family Medicine | Admitting: Family Medicine

## 2021-02-24 VITALS — BP 110/58 | HR 94 | Wt 187.0 lb

## 2021-02-24 DIAGNOSIS — O99013 Anemia complicating pregnancy, third trimester: Secondary | ICD-10-CM | POA: Insufficient documentation

## 2021-02-24 DIAGNOSIS — Z3A36 36 weeks gestation of pregnancy: Secondary | ICD-10-CM

## 2021-02-24 DIAGNOSIS — O9A113 Malignant neoplasm complicating pregnancy, third trimester: Secondary | ICD-10-CM | POA: Insufficient documentation

## 2021-02-24 DIAGNOSIS — D069 Carcinoma in situ of cervix, unspecified: Secondary | ICD-10-CM | POA: Diagnosis not present

## 2021-02-24 DIAGNOSIS — D649 Anemia, unspecified: Secondary | ICD-10-CM | POA: Diagnosis not present

## 2021-02-24 DIAGNOSIS — Z3480 Encounter for supervision of other normal pregnancy, unspecified trimester: Secondary | ICD-10-CM

## 2021-02-24 DIAGNOSIS — O99019 Anemia complicating pregnancy, unspecified trimester: Secondary | ICD-10-CM

## 2021-02-24 NOTE — Progress Notes (Signed)
   PRENATAL VISIT NOTE  Subjective:  Maria Davis is a 30 y.o. G2P1001 at [redacted]w[redacted]d being seen today for ongoing prenatal care.  She is currently monitored for the following issues for this low-risk pregnancy and has Supervision of other normal pregnancy, antepartum; CIN III (cervical intraepithelial neoplasia grade III) with severe dysplasia; Alpha thalassemia silent carrier; Unwanted fertility; and Anemia, antepartum on their problem list.  Patient reports no complaints.  Contractions: Irritability. Vag. Bleeding: None.  Movement: Present. Denies leaking of fluid.   The following portions of the patient's history were reviewed and updated as appropriate: allergies, current medications, past family history, past medical history, past social history, past surgical history and problem list.   Objective:   Vitals:   02/24/21 1002  BP: (!) 110/58  Pulse: 94  Weight: 187 lb (84.8 kg)    Fetal Status: Fetal Heart Rate (bpm): 140   Movement: Present     General:  Alert, oriented and cooperative. Patient is in no acute distress.  Skin: Skin is warm and dry. No rash noted.   Cardiovascular: Normal heart rate noted  Respiratory: Normal respiratory effort, no problems with respiration noted  Abdomen: Soft, gravid, appropriate for gestational age.  Pain/Pressure: Present     Pelvic: Cervical exam deferred        Extremities: Normal range of motion.  Edema: None  Mental Status: Normal mood and affect. Normal behavior. Normal judgment and thought content.   Assessment and Plan:  Pregnancy: G2P1001 at [redacted]w[redacted]d 1. [redacted] weeks gestation of pregnancy FHT and FH normal - Culture, beta strep (group b only) - GC/Chlamydia probe amp ([place])not at [facility]  2. Supervision of other normal pregnancy, antepartum - Culture, beta strep (group b only) - GC/Chlamydia probe amp ([place])not at [facility]  3. CIN III (cervical intraepithelial neoplasia grade III) with severe dysplasia colpo postpartum  4. Anemia,  antepartum On oral iron  Preterm labor symptoms and general obstetric precautions including but not limited to vaginal bleeding, contractions, leaking of fluid and fetal movement were reviewed in detail with the patient. Please refer to After Visit Summary for other counseling recommendations.   No follow-ups on file.  Future Appointments  Date Time Provider Department Center  03/03/2021 10:55 AM Reva Bores, MD [facility] None  03/10/2021 10:15 AM Levie Heritage, DO [facility] None  03/17/2021 10:15 AM Levie Heritage, DO [facility] None    Levie Heritage, DO

## 2021-02-25 LAB — GC/CHLAMYDIA PROBE AMP (~~LOC~~) NOT AT ARMC
Chlamydia: NEGATIVE
Comment: NEGATIVE
Comment: NORMAL
Neisseria Gonorrhea: NEGATIVE

## 2021-03-01 LAB — CULTURE, BETA STREP (GROUP B ONLY): Strep Gp B Culture: NEGATIVE

## 2021-03-03 ENCOUNTER — Encounter: Payer: BC Managed Care – PPO | Admitting: Family Medicine

## 2021-03-10 ENCOUNTER — Ambulatory Visit (INDEPENDENT_AMBULATORY_CARE_PROVIDER_SITE_OTHER): Payer: Medicaid Other | Admitting: Family Medicine

## 2021-03-10 ENCOUNTER — Other Ambulatory Visit: Payer: Self-pay

## 2021-03-10 VITALS — BP 108/56 | HR 87 | Wt 188.0 lb

## 2021-03-10 DIAGNOSIS — O99019 Anemia complicating pregnancy, unspecified trimester: Secondary | ICD-10-CM

## 2021-03-10 DIAGNOSIS — D069 Carcinoma in situ of cervix, unspecified: Secondary | ICD-10-CM

## 2021-03-10 DIAGNOSIS — Z3480 Encounter for supervision of other normal pregnancy, unspecified trimester: Secondary | ICD-10-CM

## 2021-03-10 DIAGNOSIS — Z3A38 38 weeks gestation of pregnancy: Secondary | ICD-10-CM

## 2021-03-10 NOTE — Progress Notes (Signed)
   PRENATAL VISIT NOTE  Subjective:  Maria Davis is a 30 y.o. G2P1001 at [redacted]w[redacted]d being seen today for ongoing prenatal care.  She is currently monitored for the following issues for this low-risk pregnancy and has Supervision of other normal pregnancy, antepartum; CIN III (cervical intraepithelial neoplasia grade III) with severe dysplasia; Alpha thalassemia silent carrier; Unwanted fertility; and Anemia, antepartum on their problem list.  Patient reports occasional contractions.  Contractions: Irregular. Vag. Bleeding: None.  Movement: Present. Denies leaking of fluid.   The following portions of the patient's history were reviewed and updated as appropriate: allergies, current medications, past family history, past medical history, past social history, past surgical history and problem list.   Objective:   Vitals:   03/10/21 1020  BP: (!) 108/56  Pulse: 87  Weight: 188 lb (85.3 kg)    Fetal Status: Fetal Heart Rate (bpm): 140 Fundal Height: 38 cm Movement: Present  Presentation: Vertex  General:  Alert, oriented and cooperative. Patient is in no acute distress.  Skin: Skin is warm and dry. No rash noted.   Cardiovascular: Normal heart rate noted  Respiratory: Normal respiratory effort, no problems with respiration noted  Abdomen: Soft, gravid, appropriate for gestational age.  Pain/Pressure: Present     Pelvic: Cervical exam deferred        Extremities: Normal range of motion.  Edema: None  Mental Status: Normal mood and affect. Normal behavior. Normal judgment and thought content.   Assessment and Plan:  Pregnancy: G2P1001 at [redacted]w[redacted]d 1. [redacted] weeks gestation of pregnancy  2. Supervision of other normal pregnancy, antepartum FHT and FH normal  3. CIN III (cervical intraepithelial neoplasia grade III) with severe dysplasia Colpo postpartum  4. Anemia, antepartum On iron   Term labor symptoms and general obstetric precautions including but not limited to vaginal bleeding,  contractions, leaking of fluid and fetal movement were reviewed in detail with the patient. Please refer to After Visit Summary for other counseling recommendations.   No follow-ups on file.  Future Appointments  Date Time Provider Department Center  03/17/2021 10:15 AM Levie Heritage, DO [facility] None    Levie Heritage, DO

## 2021-03-14 ENCOUNTER — Other Ambulatory Visit: Payer: Self-pay | Admitting: Family Medicine

## 2021-03-14 ENCOUNTER — Inpatient Hospital Stay (HOSPITAL_COMMUNITY)
Admission: AD | Admit: 2021-03-14 | Discharge: 2021-03-14 | Disposition: A | Discharge status: Home / Self Care | Payer: Medicaid Other | Attending: Obstetrics & Gynecology | Admitting: Obstetrics & Gynecology

## 2021-03-14 ENCOUNTER — Encounter (HOSPITAL_COMMUNITY): Payer: Self-pay | Admitting: Obstetrics & Gynecology

## 2021-03-14 DIAGNOSIS — Z3689 Encounter for other specified antenatal screening: Secondary | ICD-10-CM

## 2021-03-14 DIAGNOSIS — O368130 Decreased fetal movements, third trimester, not applicable or unspecified: Secondary | ICD-10-CM | POA: Insufficient documentation

## 2021-03-14 DIAGNOSIS — Z87891 Personal history of nicotine dependence: Secondary | ICD-10-CM | POA: Diagnosis not present

## 2021-03-14 DIAGNOSIS — Z3A39 39 weeks gestation of pregnancy: Secondary | ICD-10-CM | POA: Insufficient documentation

## 2021-03-14 DIAGNOSIS — O471 False labor at or after 37 completed weeks of gestation: Secondary | ICD-10-CM | POA: Diagnosis not present

## 2021-03-14 DIAGNOSIS — O479 False labor, unspecified: Secondary | ICD-10-CM

## 2021-03-14 NOTE — MAU Provider Note (Signed)
History     CSN: [id number]  Arrival date and time: 03/14/21 2059   Event Date/Time   First Provider Initiated Contact with Patient 03/14/21 2206      Chief Complaint  Patient presents with   Contractions   Maria Davis is a 30 y.o. G2P1001 at [redacted]w[redacted]d who receives care at [facility].  She presents today for Contractions.  She states she has been contracting throughout the day and 2 hours ago "they got really intense in my bottom area."  She states she did not feel baby moving "as consistently as he usually was" throughout the day.  She reports trying to eat and drink with no change in movement.  However, she states she does detect movement since arrival.  She states she wasn't truly concerned, because she has her own doppler at home. She denies vaginal bleeding or leaking of fluid.   OB History     Gravida  2   Para  1   Term  1   Preterm      AB      Living  1      SAB      IAB      Ectopic      Multiple      Live Births  1           History reviewed. No pertinent past medical history.  Past Surgical History:  Procedure Laterality Date   WISDOM TOOTH EXTRACTION  2019    Family History  Problem Relation Age of Onset   Hypertension Sister    Asthma Son    Hypertension Maternal Grandmother     Social History   Tobacco Use   Smoking status: Former    Packs/day: 0.50    Types: Cigarettes    Quit date: 06/19/2020    Years since quitting: 0.7   Smokeless tobacco: Never  Vaping Use   Vaping Use: Never used  Substance Use Topics   Alcohol use: Yes    Comment: occasional   Drug use: No    Allergies:  Allergies  Allergen Reactions   Tomato Itching and Nausea And Vomiting    Throat itching    Medications Prior to Admission  Medication Sig Dispense Refill Last Dose   ferrous sulfate 325 (65 FE) MG tablet Take 325 mg by mouth daily with breakfast. Pt taking every other day.   03/14/2021 at 0630   Prenatal Vit-Fe Fumarate-FA (PRENATAL VITAMIN) 27-0.8  MG TABS Take 1 tablet daily. 30 tablet 11 03/14/2021 at 0630    Review of Systems  Gastrointestinal:  Negative for abdominal pain, nausea and vomiting.  Genitourinary:  Negative for difficulty urinating, dysuria, vaginal bleeding and vaginal discharge.  Musculoskeletal:  Negative for back pain.  Neurological:  Negative for dizziness, light-headedness and headaches.  Physical Exam   Blood pressure 112/63, pulse 90, temperature 98.1 F (36.7 C), temperature source Oral, resp. rate 17, height 5\' 3"  (1.6 m), weight 85.3 kg, last menstrual period 06/14/2020, SpO2 100 %.  Physical Exam Vitals reviewed. Exam conducted with a chaperone present.  Constitutional:      Appearance: Normal appearance.  HENT:     Head: Normocephalic and atraumatic.  Eyes:     Conjunctiva/sclera: Conjunctivae normal.  Cardiovascular:     Rate and Rhythm: Normal rate.  Pulmonary:     Effort: Pulmonary effort is normal. No respiratory distress.  Abdominal:     Palpations: Abdomen is soft.     Comments: Gravid, Appears AGA  Skin:  General: Skin is warm and dry.  Neurological:     Mental Status: She is alert and oriented to person, place, and time.  Psychiatric:        Mood and Affect: Mood normal.        Behavior: Behavior normal.        Thought Content: Thought content normal.    Fetal Assessment 135 bpm, Mod Var, -Decels, +Accels Toco: Irregular  MAU Course  No results found for this or any previous visit (from the past 24 hour(s)). No results found.  MDM PE Labs: None EFM  Assessment and Plan  30 year old G2P1001  SIUP at 42 weeks Cat I FT DFM Contractions  -POC Reviewed -Patient states she does not want to stay for cervical recheck. -Given marker to document fetal movement.  -Educated on how perception of contractions in lower back likely related to OP position.  -Reviewed methods to promote fetal descent and rotation including squats, pelvic thrusts, and hands/knees  positioning. -Will monitor and reassess. -NST Reactive   Cherre Robins MSN, CNM 03/14/2021, 10:06 PM   Reassessment (10:28 PM) -Good fetal movement perceived -Patient continues to desire discharge. -NST remains reactive -Keep appt as scheduled for Thursday Sept 29th with Dr. Manus Rudd. -Encouraged to call primary office or return to MAU if symptoms worsen or with the onset of new symptoms. -Discharged to home in stable condition.  Cherre Robins MSN, CNM Advanced Practice Provider, [facility] [employer]

## 2021-03-14 NOTE — MAU Note (Signed)
Patient declined reassessment of SVE, request to go home after EFM monitoring.

## 2021-03-14 NOTE — MAU Note (Signed)
..  Maria Davis is a 30 y.o. at [redacted]w[redacted]d here in MAU reporting: contractions every 5 minutes and pain on her tailbone. Denies vaginal bleeding or leaking of fluid. Report less movement than usual.   Pain score: contractions 7/10; tailbone 9/10 Vitals:   03/14/21 2128  BP: 112/63  Pulse: 90  Resp: 17  Temp: 98.1 F (36.7 C)  SpO2: 100%     KHT:XHFSFSE wearing a dress

## 2021-03-16 ENCOUNTER — Telehealth (HOSPITAL_COMMUNITY): Payer: Self-pay | Admitting: *Deleted

## 2021-03-16 NOTE — Telephone Encounter (Signed)
Preadmission screen  

## 2021-03-17 ENCOUNTER — Ambulatory Visit (INDEPENDENT_AMBULATORY_CARE_PROVIDER_SITE_OTHER): Payer: Medicaid Other | Admitting: Family Medicine

## 2021-03-17 ENCOUNTER — Telehealth (HOSPITAL_COMMUNITY): Payer: Self-pay | Admitting: *Deleted

## 2021-03-17 ENCOUNTER — Encounter (HOSPITAL_COMMUNITY): Payer: Self-pay | Admitting: *Deleted

## 2021-03-17 ENCOUNTER — Encounter (HOSPITAL_COMMUNITY): Payer: Self-pay

## 2021-03-17 ENCOUNTER — Other Ambulatory Visit: Payer: Self-pay

## 2021-03-17 VITALS — BP 110/61 | HR 89 | Wt 188.0 lb

## 2021-03-17 DIAGNOSIS — Z3A39 39 weeks gestation of pregnancy: Secondary | ICD-10-CM

## 2021-03-17 DIAGNOSIS — D069 Carcinoma in situ of cervix, unspecified: Secondary | ICD-10-CM

## 2021-03-17 DIAGNOSIS — Z3480 Encounter for supervision of other normal pregnancy, unspecified trimester: Secondary | ICD-10-CM

## 2021-03-17 DIAGNOSIS — Z3009 Encounter for other general counseling and advice on contraception: Secondary | ICD-10-CM

## 2021-03-17 NOTE — Progress Notes (Signed)
   PRENATAL VISIT NOTE  Subjective:  Maria Davis is a 30 y.o. G2P1001 at [redacted]w[redacted]d being seen today for ongoing prenatal care.  She is currently monitored for the following issues for this low-risk pregnancy and has Supervision of other normal pregnancy, antepartum; CIN III (cervical intraepithelial neoplasia grade III) with severe dysplasia; Alpha thalassemia silent carrier; Unwanted fertility; and Anemia, antepartum on their problem list.  Patient reports no complaints.  Contractions: Irregular. Vag. Bleeding: None.  Movement: Present. Denies leaking of fluid.   The following portions of the patient's history were reviewed and updated as appropriate: allergies, current medications, past family history, past medical history, past social history, past surgical history and problem list.   Objective:   Vitals:   03/17/21 1022  BP: 110/61  Pulse: 89  Weight: 188 lb (85.3 kg)    Fetal Status: Fetal Heart Rate (bpm): 139   Movement: Present     General:  Alert, oriented and cooperative. Patient is in no acute distress.  Skin: Skin is warm and dry. No rash noted.   Cardiovascular: Normal heart rate noted  Respiratory: Normal respiratory effort, no problems with respiration noted  Abdomen: Soft, gravid, appropriate for gestational age.  Pain/Pressure: Present     Pelvic: Cervical exam deferred        Extremities: Normal range of motion.  Edema: None  Mental Status: Normal mood and affect. Normal behavior. Normal judgment and thought content.   Assessment and Plan:  Pregnancy: G2P1001 at [redacted]w[redacted]d 1. [redacted] weeks gestation of pregnancy  2. Supervision of other normal pregnancy, antepartum FHT and FH normal  3. CIN III (cervical intraepithelial neoplasia grade III) with severe dysplasia Needs colposcopy postpartum   Term labor symptoms and general obstetric precautions including but not limited to vaginal bleeding, contractions, leaking of fluid and fetal movement were reviewed in detail with  the patient. Please refer to After Visit Summary for other counseling recommendations.   No follow-ups on file.  Future Appointments  Date Time Provider Department Center  03/23/2021  6:30 AM [facility] SCHED ROOM [facility] None  04/28/2021 10:35 AM Levie Heritage, DO [facility] None    Levie Heritage, DO

## 2021-03-17 NOTE — Telephone Encounter (Signed)
Preadmission screen  

## 2021-03-18 ENCOUNTER — Inpatient Hospital Stay (HOSPITAL_COMMUNITY)
Admission: AD | Admit: 2021-03-18 | Discharge: 2021-03-19 | DRG: 807 | Disposition: A | Discharge status: Home / Self Care | Payer: Medicaid Other | Attending: Obstetrics and Gynecology | Admitting: Obstetrics and Gynecology

## 2021-03-18 ENCOUNTER — Encounter (HOSPITAL_COMMUNITY): Payer: Self-pay | Admitting: Obstetrics and Gynecology

## 2021-03-18 ENCOUNTER — Inpatient Hospital Stay (HOSPITAL_COMMUNITY): Payer: Medicaid Other | Admitting: Anesthesiology

## 2021-03-18 DIAGNOSIS — Z20822 Contact with and (suspected) exposure to covid-19: Secondary | ICD-10-CM | POA: Diagnosis present

## 2021-03-18 DIAGNOSIS — D563 Thalassemia minor: Secondary | ICD-10-CM | POA: Diagnosis present

## 2021-03-18 DIAGNOSIS — Z87891 Personal history of nicotine dependence: Secondary | ICD-10-CM | POA: Diagnosis not present

## 2021-03-18 DIAGNOSIS — O4292 Full-term premature rupture of membranes, unspecified as to length of time between rupture and onset of labor: Secondary | ICD-10-CM | POA: Diagnosis present

## 2021-03-18 DIAGNOSIS — D069 Carcinoma in situ of cervix, unspecified: Secondary | ICD-10-CM | POA: Diagnosis present

## 2021-03-18 DIAGNOSIS — Z3A39 39 weeks gestation of pregnancy: Secondary | ICD-10-CM | POA: Diagnosis not present

## 2021-03-18 DIAGNOSIS — Z3490 Encounter for supervision of normal pregnancy, unspecified, unspecified trimester: Secondary | ICD-10-CM | POA: Insufficient documentation

## 2021-03-18 DIAGNOSIS — O99892 Other specified diseases and conditions complicating childbirth: Secondary | ICD-10-CM | POA: Diagnosis present

## 2021-03-18 DIAGNOSIS — O99019 Anemia complicating pregnancy, unspecified trimester: Secondary | ICD-10-CM

## 2021-03-18 DIAGNOSIS — O4202 Full-term premature rupture of membranes, onset of labor within 24 hours of rupture: Secondary | ICD-10-CM | POA: Diagnosis not present

## 2021-03-18 DIAGNOSIS — Z3480 Encounter for supervision of other normal pregnancy, unspecified trimester: Secondary | ICD-10-CM

## 2021-03-18 LAB — CBC
HCT: 33.3 % — ABNORMAL LOW (ref 36.0–46.0)
Hemoglobin: 10.8 g/dL — ABNORMAL LOW (ref 12.0–15.0)
MCH: 26.2 pg (ref 26.0–34.0)
MCHC: 32.4 g/dL (ref 30.0–36.0)
MCV: 80.8 fL (ref 80.0–100.0)
Platelets: 301 10*3/uL (ref 150–400)
RBC: 4.12 MIL/uL (ref 3.87–5.11)
RDW: 14.6 % (ref 11.5–15.5)
WBC: 9.7 10*3/uL (ref 4.0–10.5)
nRBC: 0 % (ref 0.0–0.2)

## 2021-03-18 LAB — TYPE AND SCREEN
ABO/RH(D): O POS
Antibody Screen: NEGATIVE

## 2021-03-18 LAB — RESP PANEL BY RT-PCR (FLU A&B, COVID) ARPGX2
Influenza A by PCR: NEGATIVE
Influenza B by PCR: NEGATIVE
SARS Coronavirus 2 by RT PCR: NEGATIVE

## 2021-03-18 LAB — POCT FERN TEST: POCT Fern Test: POSITIVE

## 2021-03-18 LAB — RPR: RPR Ser Ql: NONREACTIVE

## 2021-03-18 MED ORDER — LACTATED RINGERS IV SOLN
500.0000 mL | Freq: Once | INTRAVENOUS | Status: AC
  Administered 2021-03-18: 500 mL via INTRAVENOUS

## 2021-03-18 MED ORDER — ONDANSETRON HCL 4 MG/2ML IJ SOLN: 4.0000 mg | Freq: Four times a day (QID) | INTRAMUSCULAR | Status: DC | PRN

## 2021-03-18 MED ORDER — LACTATED RINGERS IV SOLN: INTRAVENOUS | Status: DC

## 2021-03-18 MED ORDER — SIMETHICONE 80 MG PO CHEW: 80.0000 mg | CHEWABLE_TABLET | ORAL | Status: DC | PRN

## 2021-03-18 MED ORDER — FENTANYL-BUPIVACAINE-NACL 0.5-0.125-0.9 MG/250ML-% EP SOLN
EPIDURAL | Status: DC | PRN
  Administered 2021-03-18: 12 mL/h via EPIDURAL

## 2021-03-18 MED ORDER — OXYTOCIN-SODIUM CHLORIDE 30-0.9 UT/500ML-% IV SOLN
1.0000 m[IU]/min | INTRAVENOUS | Status: DC
  Administered 2021-03-18: 2 m[IU]/min via INTRAVENOUS
  Filled 2021-03-18: qty 500

## 2021-03-18 MED ORDER — EPHEDRINE 5 MG/ML INJ
10.0000 mg | INTRAVENOUS | Status: DC | PRN
Start: 2021-03-18 — End: 2021-03-18

## 2021-03-18 MED ORDER — ACETAMINOPHEN 325 MG PO TABS
650.0000 mg | ORAL_TABLET | ORAL | Status: DC | PRN
Start: 2021-03-18 — End: 2021-03-19

## 2021-03-18 MED ORDER — WITCH HAZEL-GLYCERIN EX PADS: 1.0000 "application " | MEDICATED_PAD | CUTANEOUS | Status: DC | PRN

## 2021-03-18 MED ORDER — OXYCODONE HCL 5 MG PO TABS: 5.0000 mg | ORAL_TABLET | ORAL | Status: DC | PRN

## 2021-03-18 MED ORDER — SENNOSIDES-DOCUSATE SODIUM 8.6-50 MG PO TABS
2.0000 | ORAL_TABLET | Freq: Every day | ORAL | Status: DC
  Administered 2021-03-19: 2 via ORAL
  Filled 2021-03-18: qty 2

## 2021-03-18 MED ORDER — DIBUCAINE (PERIANAL) 1 % EX OINT: 1.0000 "application " | TOPICAL_OINTMENT | CUTANEOUS | Status: DC | PRN

## 2021-03-18 MED ORDER — PHENYLEPHRINE 40 MCG/ML (10ML) SYRINGE FOR IV PUSH (FOR BLOOD PRESSURE SUPPORT)
80.0000 ug | PREFILLED_SYRINGE | INTRAVENOUS | Status: DC | PRN
  Filled 2021-03-18: qty 10

## 2021-03-18 MED ORDER — ONDANSETRON HCL 4 MG PO TABS: 4.0000 mg | ORAL_TABLET | ORAL | Status: DC | PRN

## 2021-03-18 MED ORDER — LIDOCAINE HCL (PF) 1 % IJ SOLN: 30.0000 mL | INTRAMUSCULAR | Status: DC | PRN

## 2021-03-18 MED ORDER — OXYCODONE-ACETAMINOPHEN 5-325 MG PO TABS: 1.0000 | ORAL_TABLET | ORAL | Status: DC | PRN

## 2021-03-18 MED ORDER — LIDOCAINE HCL (PF) 1 % IJ SOLN
INTRAMUSCULAR | Status: DC | PRN
  Administered 2021-03-18 (×2): 4 mL via EPIDURAL

## 2021-03-18 MED ORDER — PHENYLEPHRINE 40 MCG/ML (10ML) SYRINGE FOR IV PUSH (FOR BLOOD PRESSURE SUPPORT): 80.0000 ug | PREFILLED_SYRINGE | INTRAVENOUS | Status: DC | PRN

## 2021-03-18 MED ORDER — DIPHENHYDRAMINE HCL 25 MG PO CAPS: 25.0000 mg | ORAL_CAPSULE | Freq: Four times a day (QID) | ORAL | Status: DC | PRN

## 2021-03-18 MED ORDER — PRENATAL MULTIVITAMIN CH
1.0000 | ORAL_TABLET | Freq: Every day | ORAL | Status: DC
  Administered 2021-03-19: 1 via ORAL
  Filled 2021-03-18: qty 1

## 2021-03-18 MED ORDER — ACETAMINOPHEN 325 MG PO TABS: 650.0000 mg | ORAL_TABLET | ORAL | Status: DC | PRN

## 2021-03-18 MED ORDER — OXYTOCIN BOLUS FROM INFUSION
333.0000 mL | Freq: Once | INTRAVENOUS | Status: AC
  Administered 2021-03-18: 333 mL via INTRAVENOUS

## 2021-03-18 MED ORDER — TETANUS-DIPHTH-ACELL PERTUSSIS 5-2.5-18.5 LF-MCG/0.5 IM SUSY: 0.5000 mL | PREFILLED_SYRINGE | Freq: Once | INTRAMUSCULAR | Status: DC

## 2021-03-18 MED ORDER — OXYCODONE-ACETAMINOPHEN 5-325 MG PO TABS: 2.0000 | ORAL_TABLET | ORAL | Status: DC | PRN

## 2021-03-18 MED ORDER — BENZOCAINE-MENTHOL 20-0.5 % EX AERO: 1.0000 "application " | INHALATION_SPRAY | CUTANEOUS | Status: DC | PRN

## 2021-03-18 MED ORDER — LACTATED RINGERS IV SOLN: 500.0000 mL | INTRAVENOUS | Status: DC | PRN

## 2021-03-18 MED ORDER — OXYTOCIN-SODIUM CHLORIDE 30-0.9 UT/500ML-% IV SOLN: 2.5000 [IU]/h | INTRAVENOUS | Status: DC

## 2021-03-18 MED ORDER — TERBUTALINE SULFATE 1 MG/ML IJ SOLN: 0.2500 mg | Freq: Once | INTRAMUSCULAR | Status: DC | PRN

## 2021-03-18 MED ORDER — COCONUT OIL OIL: 1.0000 "application " | TOPICAL_OIL | Status: DC | PRN

## 2021-03-18 MED ORDER — DIPHENHYDRAMINE HCL 50 MG/ML IJ SOLN
12.5000 mg | INTRAMUSCULAR | Status: DC | PRN
Start: 2021-03-18 — End: 2021-03-18

## 2021-03-18 MED ORDER — IBUPROFEN 600 MG PO TABS
600.0000 mg | ORAL_TABLET | Freq: Four times a day (QID) | ORAL | Status: DC
Start: 2021-03-18 — End: 2021-03-19
  Administered 2021-03-18 – 2021-03-19 (×4): 600 mg via ORAL
  Filled 2021-03-18 (×4): qty 1

## 2021-03-18 MED ORDER — OXYCODONE HCL 5 MG PO TABS: 10.0000 mg | ORAL_TABLET | ORAL | Status: DC | PRN

## 2021-03-18 MED ORDER — ONDANSETRON HCL 4 MG/2ML IJ SOLN: 4.0000 mg | INTRAMUSCULAR | Status: DC | PRN

## 2021-03-18 MED ORDER — FENTANYL-BUPIVACAINE-NACL 0.5-0.125-0.9 MG/250ML-% EP SOLN
12.0000 mL/h | EPIDURAL | Status: DC | PRN
Start: 2021-03-18 — End: 2021-03-18
  Filled 2021-03-18: qty 250

## 2021-03-18 MED ORDER — SOD CITRATE-CITRIC ACID 500-334 MG/5ML PO SOLN
30.0000 mL | ORAL | Status: DC | PRN
  Administered 2021-03-18: 30 mL via ORAL
  Filled 2021-03-18: qty 30

## 2021-03-18 MED ORDER — EPHEDRINE 5 MG/ML INJ: 10.0000 mg | INTRAVENOUS | Status: DC | PRN

## 2021-03-18 NOTE — Anesthesia Procedure Notes (Signed)
Epidural Patient location during procedure: OB Start time: 03/18/2021 9:20 AM End time: 03/18/2021 9:26 AM  Staffing Anesthesiologist: Kaylyn Layer, MD Performed: anesthesiologist   Preanesthetic Checklist Completed: patient identified, IV checked, risks and benefits discussed, monitors and equipment checked, pre-op evaluation and timeout performed  Epidural Patient position: sitting Prep: DuraPrep and site prepped and draped Patient monitoring: continuous pulse ox, blood pressure and heart rate Approach: midline Location: L3-L4 Injection technique: LOR air  Needle:  Needle type: Tuohy  Needle gauge: 17 G Needle length: 9 cm Needle insertion depth: 6 cm Catheter type: closed end flexible Catheter size: 19 Gauge Catheter at skin depth: 11 cm Test dose: negative and Other (1% lidocaine)  Assessment Events: blood not aspirated, injection not painful, no injection resistance, no paresthesia and negative IV test  Additional Notes Patient identified. Risks, benefits, and alternatives discussed with patient including but not limited to bleeding, infection, nerve damage, paralysis, failed block, incomplete pain control, headache, blood pressure changes, nausea, vomiting, reactions to medication, itching, and postpartum back pain. Confirmed with bedside nurse the patient's most recent platelet count. Confirmed with patient that they are not currently taking any anticoagulation, have any bleeding history, or any family history of bleeding disorders. Patient expressed understanding and wished to proceed. All questions were answered. Sterile technique was used throughout the entire procedure. Please see nursing notes for vital signs.   Difficult due to inadequate patient positioning, 3 attempts required. Crisp LOR at 6cm. Test dose was given through epidural catheter and negative prior to continuing to dose epidural or start infusion. Warning signs of high block given to the patient  including shortness of breath, tingling/numbness in hands, complete motor block, or any concerning symptoms with instructions to call for help. Patient was given instructions on fall risk and not to get out of bed. All questions and concerns addressed with instructions to call with any issues or inadequate analgesia.  Reason for block:procedure for pain

## 2021-03-18 NOTE — Lactation Note (Signed)
This note was copied from a baby's chart. Lactation Consultation Note  Patient Name: Boy Sheritha Louis [id number] Date: 03/18/2021 Reason for consult: L&D Initial assessment;1st time breastfeeding;Term Age:32 hours  Easy latch to breast in football hold.  P2 Mom with large breasts and erect nipples and compressible areola.  Baby latched deeply.   Encouraged STS and cue based feeding.  Mom states that she wanted to pump and bottle, but is open to latching since baby latched easily.  To ask her RN to set her up pumping.  LC to F/U on floor.  Maternal Data Has patient been taught Hand Expression?: Yes Does the patient have breastfeeding experience prior to this delivery?: No  Feeding Mother's Current Feeding Choice: Breast Milk and Formula  LATCH Score Latch: Grasps breast easily, tongue down, lips flanged, rhythmical sucking.  Audible Swallowing: A few with stimulation  Type of Nipple: Everted at rest and after stimulation  Comfort (Breast/Nipple): Soft / non-tender  Hold (Positioning): Assistance needed to correctly position infant at breast and maintain latch.  LATCH Score: 8   Interventions Interventions: Breast feeding basics reviewed;Assisted with latch;Skin to skin;Breast massage;Hand express;Support pillows;Position options   Consult Status Consult Status: Follow-up from L&D Date: 03/18/21 Follow-up type: In-patient    Judee Clara 03/18/2021, 3:19 PM

## 2021-03-18 NOTE — Discharge Summary (Addendum)
Postpartum Discharge Summary      Patient Name: Maria Davis DOB: [birth year] MRN: [id number]  Date of admission: 03/18/2021 Delivery date:03/18/2021  Delivering provider: Gifford Shave  Date of discharge: 03/19/2021  Admitting diagnosis: Encounter for induction of labor [Z34.90] Intrauterine pregnancy: [redacted]w[redacted]d     Secondary diagnosis:  Active Problems:   CIN III (cervical intraepithelial neoplasia grade III) with severe dysplasia   Alpha thalassemia silent carrier  Additional problems: None    Discharge diagnosis: Term Pregnancy Delivered                                              Post partum procedures: None Augmentation: AROM and Pitocin Complications: None  Hospital course: Onset of Labor With Vaginal Delivery      30 y.o. yo G2P1001 at [redacted]w[redacted]d was admitted in Latent Labor on 03/18/2021. Patient had an uncomplicated labor course as follows:  Membrane Rupture Time/Date: 5:00 AM ,03/18/2021   Delivery Method:Vaginal, Spontaneous  Episiotomy: None  Lacerations:  1st degree;Perineal  Patient had an uncomplicated postpartum course.  She is ambulating, tolerating a regular diet, passing flatus, and urinating well. Patient is discharged home in stable condition on 03/19/21.  Newborn Data: Birth date:03/18/2021  Birth time:2:18 PM  Gender:Female  Living status:Living  Apgars:9 ,9  Weight:3311 g (7lb 4.8oz)  Magnesium Sulfate received: No BMZ received: No Rhophylac:N/A MMR:N/A T-DaP:Given prenatally Flu: No Transfusion:No  Physical exam  Vitals:   03/18/21 1751 03/18/21 2045 03/19/21 0000 03/19/21 0529  BP: 111/66 111/60 117/63 111/61  Pulse: 96 84 81 73  Resp: $Remo'18 20 20 18  'ABaeI$ Temp: 98.7 F (37.1 C) 97.8 F (36.6 C) 98.1 F (36.7 C) 97.9 F (36.6 C)  TempSrc: Oral Oral Oral Oral  SpO2:  100% 100% 100%  Weight:      Height:       General: alert, cooperative, and no distress Lochia: appropriate Uterine Fundus: firm Incision: N/A DVT Evaluation: No evidence of  DVT seen on physical exam. No significant calf/ankle edema. Labs: Lab Results  Component Value Date   WBC 11.7 (H) 03/19/2021   HGB 9.4 (L) 03/19/2021   HCT 28.7 (L) 03/19/2021   MCV 79.9 (L) 03/19/2021   PLT 246 03/19/2021   CMP Latest Ref Rng & Units 09/16/2020  Glucose 65 - 99 mg/dL 76  BUN 6 - 20 mg/dL 6  Creatinine 0.57 - 1.00 mg/dL 0.43(L)  Sodium 134 - 144 mmol/L 137  Potassium 3.5 - 5.2 mmol/L 3.6  Chloride 96 - 106 mmol/L 104  CO2 20 - 29 mmol/L 19(L)  Calcium 8.7 - 10.2 mg/dL 9.4  Total Protein 6.0 - 8.5 g/dL 6.5  Total Bilirubin 0.0 - 1.2 mg/dL <0.2  Alkaline Phos 44 - 121 IU/L 58  AST 0 - 40 IU/L 16  ALT 0 - 32 IU/L 23   Edinburgh Score: No flowsheet data found.  After visit meds:  Allergies as of 03/19/2021       Reactions   Tomato Itching, Nausea And Vomiting   Throat itching        Medication List     TAKE these medications    acetaminophen 325 MG tablet Commonly known as: Tylenol Take 2 tablets (650 mg total) by mouth every 6 (six) hours as needed for moderate pain or mild pain.   ferrous sulfate 325 (65 FE) MG tablet Take 325  mg by mouth daily with breakfast. Pt taking every other day.   ibuprofen 200 MG tablet Commonly known as: ADVIL Take 3 tablets (600 mg total) by mouth every 6 (six) hours as needed for moderate pain or mild pain.   Prenatal Vitamin 27-0.8 MG Tabs Take 1 tablet daily.         Discharge home in stable condition Infant Feeding: Breast Infant Disposition:home with mother Discharge instruction: per After Visit Summary and Postpartum booklet. Activity: Advance as tolerated. Pelvic rest for 6 weeks.  Diet: routine diet Future Appointments: Future Appointments  Date Time Provider [place]  04/28/2021 10:35 AM Truett Mainland, DO [facility] None   Follow up Visit: Please schedule this patient for a In person postpartum visit in 6 weeks with the following provider: MD. Additional Postpartum F/U:Colpo  Low  risk pregnancy complicated by:  None Delivery mode:  Vaginal, Spontaneous  Anticipated Birth Control:   Abstinence   03/19/2021 Lattie Corns  PGY-1, Faculty Service  CNM attestation I have seen and examined this patient and agree with above documentation in the resident's note.   Maria Davis is a 30 y.o. G2P2002 s/p vag del.   Pain is well controlled.  Plan for birth control is abstinence.  Method of Feeding: breast  PE:  BP 111/61 (BP Location: Right Arm)   Pulse 73   Temp 97.9 F (36.6 C) (Oral)   Resp 18   Ht $R'5\' 3"'NK$  (1.6 m)   Wt 85.3 kg   LMP 06/14/2020   SpO2 100%   Breastfeeding Unknown   BMI 33.32 kg/m  Fundus firm  Recent Labs    03/18/21 0724 03/19/21 0527  HGB 10.8* 9.4*  HCT 33.3* 28.7*     Plan: discharge today - postpartum care discussed - f/u clinic in 4 weeks for postpartum visit; needs colpo after 8wks PP   Myrtis Ser, CNM 8:46 AM 03/19/2021

## 2021-03-18 NOTE — H&P (Signed)
OBSTETRIC ADMISSION HISTORY AND PHYSICAL  Maria Davis is a 30 y.o. female G2P1001 with IUP at [redacted]w[redacted]d by LMP presenting for PROM/SOL. She reports +FMs, No LOF, no VB, no blurry vision, headaches or peripheral edema, and RUQ pain.  She plans on breast and formula feeding. She request nothing for birth control. She received her prenatal care at [facility]   Dating: By LMP --->  Estimated Date of Delivery: 03/21/21  Sono:    @[redacted]w[redacted]d , CWD, normal anatomy, cephalic presentation, anterior lie, 2160g, 80% EFW   Prenatal History/Complications:  CIN III with severe dyplasia- will need colposcopy post partum  Past Medical History: History reviewed. No pertinent past medical history.  Past Surgical History: Past Surgical History:  Procedure Laterality Date   WISDOM TOOTH EXTRACTION  2019    Obstetrical History: OB History     Gravida  2   Para  1   Term  1   Preterm      AB      Living  1      SAB      IAB      Ectopic      Multiple      Live Births  1           Social History Social History   Socioeconomic History   Marital status: Single    Spouse name: Not on file   Number of children: Not on file   Years of education: Not on file   Highest education level: Not on file  Occupational History   Not on file  Tobacco Use   Smoking status: Former    Packs/day: 0.50    Types: Cigarettes    Quit date: 06/19/2020    Years since quitting: 0.7   Smokeless tobacco: Never  Vaping Use   Vaping Use: Never used  Substance and Sexual Activity   Alcohol use: Yes    Comment: occasional   Drug use: No   Sexual activity: Yes    Birth control/protection: None  Other Topics Concern   Not on file  Social History Narrative   Not on file   Social Determinants of Health   Financial Resource Strain: Not on file  Food Insecurity: Not on file  Transportation Needs: Not on file  Physical Activity: Not on file  Stress: Not on file  Social Connections: Not on file     Family History: Family History  Problem Relation Age of Onset   Hypertension Sister    Asthma Son    Hypertension Maternal Grandmother     Allergies: Allergies  Allergen Reactions   Tomato Itching and Nausea And Vomiting    Throat itching    Medications Prior to Admission  Medication Sig Dispense Refill Last Dose   ferrous sulfate 325 (65 FE) MG tablet Take 325 mg by mouth daily with breakfast. Pt taking every other day.   03/17/2021   Prenatal Vit-Fe Fumarate-FA (PRENATAL VITAMIN) 27-0.8 MG TABS Take 1 tablet daily. 30 tablet 11 03/17/2021     Review of Systems   All systems reviewed and negative except as stated in HPI  Blood pressure 114/71, pulse (!) 115, temperature 98.4 F (36.9 C), temperature source Oral, resp. rate 20, height 5\' 3"  (1.6 m), weight 85.3 kg, last menstrual period 06/14/2020, SpO2 100 %. General appearance: alert, cooperative, and no distress Lungs: Normal work of breathing  Heart: regular rate Abdomen: soft, non-tender; bowel sounds normal Extremities: Homans sign is negative, no sign of DVT Presentation: cephalic  Fetal monitoringBaseline: 130 bpm, Variability: Good {> 6 bpm), Accelerations: Reactive, and Decelerations: Absent Uterine activityFrequency: Every 5-7 minutes Dilation: 4 Effacement (%): 70 Station: -1 Exam by:: Parke Poisson MD   Prenatal labs: ABO, Rh: --/--/O POS (09/30 2119) Antibody: NEG (09/30 4174) Rubella: 4.22 (03/31 1018) RPR: Non Reactive (07/15 0933)  HBsAg: Negative (03/31 1018)  HIV: Non Reactive (07/15 0933)  GBS: Negative/-- (09/08 1014)  1 hr Glucola normal  Genetic screening low risk  Anatomy US Normal   Prenatal Transfer Tool  Maternal Diabetes: No Genetic Screening: Normal Maternal Ultrasounds/Referrals: Normal Fetal Ultrasounds or other Referrals:  None Maternal Substance Abuse:  No Significant Maternal Medications:  None Significant Maternal Lab Results: Group B Strep negative  Results for orders  placed or performed during the hospital encounter of 03/18/21 (from the past 24 hour(s))  POCT fern test   Collection Time: 03/18/21  7:23 AM  Result Value Ref Range   POCT Fern Test Positive = ruptured amniotic membanes   Resp Panel by RT-PCR (Flu A&B, Covid) Nasopharyngeal Swab   Collection Time: 03/18/21  7:24 AM   Specimen: Nasopharyngeal Swab; Nasopharyngeal(NP) swabs in vial transport medium  Result Value Ref Range   SARS Coronavirus 2 by RT PCR NEGATIVE NEGATIVE   Influenza A by PCR NEGATIVE NEGATIVE   Influenza B by PCR NEGATIVE NEGATIVE  CBC   Collection Time: 03/18/21  7:24 AM  Result Value Ref Range   WBC 9.7 4.0 - 10.5 K/uL   RBC 4.12 3.87 - 5.11 MIL/uL   Hemoglobin 10.8 (L) 12.0 - 15.0 g/dL   HCT 08.1 (L) 44.8 - 18.5 %   MCV 80.8 80.0 - 100.0 fL   MCH 26.2 26.0 - 34.0 pg   MCHC 32.4 30.0 - 36.0 g/dL   RDW 63.1 49.7 - 02.6 %   Platelets 301 150 - 400 K/uL   nRBC 0.0 0.0 - 0.2 %  Type and screen   Collection Time: 03/18/21  7:24 AM  Result Value Ref Range   ABO/RH(D) O POS    Antibody Screen NEG    Sample Expiration      03/21/2021,2359 Performed at [facility], [address] [place], [place] [address]     Patient Active Problem List   Diagnosis Date Noted   Encounter for induction of labor 03/18/2021   Anemia, antepartum 01/09/2021   Unwanted fertility 11/10/2020   Alpha thalassemia silent carrier 09/30/2020   Supervision of other normal pregnancy, antepartum 08/18/2020   CIN III (cervical intraepithelial neoplasia grade III) with severe dysplasia 08/18/2020    Assessment/Plan:  Maria Davis is a 30 y.o. G2P1001 at [redacted]w[redacted]d here for PROM.   #Labor:Progressing well at this time. Cervical exam 4/70/-1 with a for bag. Due to spaced out contractions will start pitocin and consider rupture at next check.  #Pain: Epidural  #FWB: Cat 1, reassuring  #ID:  GBS neg  #MOF: Breast and formula  #MOC:None #Circ:  Yes #CIN III: patient will need postpartum  colposcopy   Derrel Nip, MD  03/18/2021, 10:00 AM

## 2021-03-18 NOTE — Anesthesia Preprocedure Evaluation (Signed)
Anesthesia Evaluation  Patient identified by MRN, date of birth, ID band Patient awake    Reviewed: Allergy & Precautions, Patient's Chart, lab work & pertinent test results  History of Anesthesia Complications Negative for: history of anesthetic complications  Airway Mallampati: II  TM Distance: >3 FB Neck ROM: Full    Dental no notable dental hx.    Pulmonary former smoker,    Pulmonary exam normal        Cardiovascular negative cardio ROS Normal cardiovascular exam     Neuro/Psych negative neurological ROS  negative psych ROS   GI/Hepatic negative GI ROS, Neg liver ROS,   Endo/Other  negative endocrine ROS  Renal/GU negative Renal ROS  negative genitourinary   Musculoskeletal negative musculoskeletal ROS (+)   Abdominal   Peds  Hematology  (+) anemia , Hgb 10.8, plt 301k   Anesthesia Other Findings Day of surgery medications reviewed with patient.  Reproductive/Obstetrics (+) Pregnancy                             Anesthesia Physical Anesthesia Plan  ASA: 2  Anesthesia Plan: Epidural   Post-op Pain Management:    Induction:   PONV Risk Score and Plan: Treatment may vary due to age or medical condition  Airway Management Planned: Natural Airway  Additional Equipment:   Intra-op Plan:   Post-operative Plan:   Informed Consent: I have reviewed the patients History and Physical, chart, labs and discussed the procedure including the risks, benefits and alternatives for the proposed anesthesia with the patient or authorized representative who has indicated his/her understanding and acceptance.       Plan Discussed with:   Anesthesia Plan Comments:         Anesthesia Quick Evaluation

## 2021-03-18 NOTE — MAU Note (Signed)
Maria Davis is a 30 y.o. at [redacted]w[redacted]d here in MAU reporting: states LOF since 0500, is still trickling. Fluid is clear, has been changing her panties. Has been contracting since the leaking started, they are every 5 min. No bleeding. +FM  Onset of complaint: today  Pain score: 8/10  Vitals:   03/18/21 0709  BP: 129/75  Pulse: 93  Resp: 16  Temp: 98.1 F (36.7 C)  SpO2: 100%     FHT:135  Lab orders placed from triage: none

## 2021-03-19 LAB — CBC
HCT: 28.7 % — ABNORMAL LOW (ref 36.0–46.0)
Hemoglobin: 9.4 g/dL — ABNORMAL LOW (ref 12.0–15.0)
MCH: 26.2 pg (ref 26.0–34.0)
MCHC: 32.8 g/dL (ref 30.0–36.0)
MCV: 79.9 fL — ABNORMAL LOW (ref 80.0–100.0)
Platelets: 246 10*3/uL (ref 150–400)
RBC: 3.59 MIL/uL — ABNORMAL LOW (ref 3.87–5.11)
RDW: 14.3 % (ref 11.5–15.5)
WBC: 11.7 10*3/uL — ABNORMAL HIGH (ref 4.0–10.5)
nRBC: 0 % (ref 0.0–0.2)

## 2021-03-19 MED ORDER — ACETAMINOPHEN 325 MG PO TABS: 650.0000 mg | ORAL_TABLET | Freq: Four times a day (QID) | ORAL | Status: DC | PRN

## 2021-03-19 MED ORDER — IBUPROFEN 200 MG PO TABS
600.0000 mg | ORAL_TABLET | Freq: Four times a day (QID) | ORAL | Status: DC | PRN
Start: 2021-03-19 — End: 2021-06-09

## 2021-03-19 NOTE — Lactation Note (Addendum)
This note was copied from a baby's chart. Lactation Consultation Note  Patient Name: Boy Cleora Karnik [id number] Date: 03/19/2021 Reason for consult: Initial assessment;Term Age:31 hours   P2 mother whose infant is now 26 hours old.  This is a term baby at 39+4 weeks.  LC order entered at 1418.  Baby was in the nursery receiving a circumcision when I arrived.  Mother has been formula feeding only.  When discussing her feeding goal, mother stated she was going to pump "when I get home."  Discussed breast stimulation with latching and/or pumping and supply and demand.  Suggested mother try to latch when he returns from the nursery.  Encouraged to call for assistance if desired.  Mother verbalized understanding.    Support person present.  RN updated.  Mother may be discharged later today.   Maternal Data    Feeding Mother's Current Feeding Choice: Breast Milk and Formula Nipple Type: Slow - flow  LATCH Score                    Lactation Tools Discussed/Used    Interventions Interventions: Education  Discharge Pump: Personal  Consult Status Consult Status: Follow-up Date: 03/20/21 Follow-up type: In-patient     Spackman R Sriansh Farra 03/19/2021, 2:21 PM

## 2021-03-19 NOTE — Anesthesia Postprocedure Evaluation (Signed)
Anesthesia Post Note  Patient: Maria Davis  Procedure(s) Performed: AN AD HOC LABOR EPIDURAL     Patient location during evaluation: Mother Baby Anesthesia Type: Epidural Level of consciousness: awake and alert Pain management: pain level controlled Vital Signs Assessment: post-procedure vital signs reviewed and stable Respiratory status: spontaneous breathing, nonlabored ventilation and respiratory function stable Cardiovascular status: stable Postop Assessment: no headache, no backache and epidural receding Anesthetic complications: no   No notable events documented.  Last Vitals:  Vitals:   03/19/21 0000 03/19/21 0529  BP: 117/63 111/61  Pulse: 81 73  Resp: 20 18  Temp: 36.7 C 36.6 C  SpO2: 100% 100%    Last Pain:  Vitals:   03/19/21 0529  TempSrc: Oral  PainSc: 0-No pain   Pain Goal: Patients Stated Pain Goal: 2 (03/18/21 2350)                 Mauricia Area

## 2021-03-23 ENCOUNTER — Inpatient Hospital Stay (HOSPITAL_COMMUNITY): Admission: AD | Admit: 2021-03-23 | Payer: Medicaid Other | Source: Home / Self Care

## 2021-03-23 ENCOUNTER — Inpatient Hospital Stay (HOSPITAL_COMMUNITY): Payer: Medicaid Other

## 2021-03-29 ENCOUNTER — Telehealth (HOSPITAL_COMMUNITY): Payer: Self-pay | Admitting: *Deleted

## 2021-03-29 NOTE — Telephone Encounter (Signed)
Hospital Discharge Follow-Up Call:  Patient reports that she is doing well and has no concerns about her healing process.  EPDS today was 2 and patient endorses this accurately reflects that she is doing well emotionally.  Patient says that baby is well and she has no concerns about baby's health.  She reports that baby sleeps in a bassinet.  She verbalized the ABCs of Safe Sleep to me.

## 2021-04-28 ENCOUNTER — Ambulatory Visit (INDEPENDENT_AMBULATORY_CARE_PROVIDER_SITE_OTHER): Payer: Medicaid Other | Admitting: Family Medicine

## 2021-04-28 ENCOUNTER — Other Ambulatory Visit: Payer: Self-pay

## 2021-04-28 ENCOUNTER — Encounter: Payer: Self-pay | Admitting: Family Medicine

## 2021-04-28 DIAGNOSIS — D069 Carcinoma in situ of cervix, unspecified: Secondary | ICD-10-CM | POA: Diagnosis not present

## 2021-04-28 DIAGNOSIS — F53 Postpartum depression: Secondary | ICD-10-CM

## 2021-04-28 DIAGNOSIS — Z30013 Encounter for initial prescription of injectable contraceptive: Secondary | ICD-10-CM | POA: Diagnosis not present

## 2021-04-28 LAB — POCT URINE PREGNANCY: Preg Test, Ur: NEGATIVE

## 2021-04-28 MED ORDER — MEDROXYPROGESTERONE ACETATE 150 MG/ML IM SUSP
150.0000 mg | Freq: Once | INTRAMUSCULAR | Status: AC
  Administered 2021-04-28: 150 mg via INTRAMUSCULAR

## 2021-04-28 MED ORDER — MEDROXYPROGESTERONE ACETATE 150 MG/ML IM SUSP: 150.0000 mg | Freq: Once | INTRAMUSCULAR | Status: DC

## 2021-04-28 NOTE — Progress Notes (Signed)
Post Partum Visit Note  Maria Davis is a 30 y.o. G71P2002 female who presents for a postpartum visit. She is 6 weeks postpartum following a normal spontaneous vaginal delivery.  I have fully reviewed the prenatal and intrapartum course. The delivery was at 39 gestational weeks.  Anesthesia: epidural. Postpartum course has been uneventful. Baby is doing well. Baby is feeding by bottle. Bleeding moderate lochia. Bowel function is normal. Bladder function is normal. Patient is not sexually active. Contraception method is Depo-Provera injections. Postpartum depression screening: positive. Score 16   The pregnancy intention screening data noted above was reviewed. Potential methods of contraception were discussed. The patient elected to proceed with No data recorded.   Edinburgh Postnatal Depression Scale - 04/28/21 1027       Edinburgh Postnatal Depression Scale:  In the Past 7 Days   I have been able to laugh and see the funny side of things. 1    I have looked forward with enjoyment to things. 2    I have blamed myself unnecessarily when things went wrong. 3    I have been anxious or worried for no good reason. 3    I have felt scared or panicky for no good reason. 1    Things have been getting on top of me. 2    I have been so unhappy that I have had difficulty sleeping. 1    I have felt sad or miserable. 2    I have been so unhappy that I have been crying. 1    The thought of harming myself has occurred to me. 0    Edinburgh Postnatal Depression Scale Total 16             Health Maintenance Due  Topic Date Due   COVID-19 Vaccine (1) Never done   Pneumococcal Vaccine 57-8 Years old (1 - PCV) Never done   PAP SMEAR-Modifier  Never done   INFLUENZA VACCINE  Never done    The following portions of the patient's history were reviewed and updated as appropriate: allergies, current medications, past family history, past medical history, past social history, past surgical history,  and problem list.  Review of Systems Pertinent items are noted in HPI.  Objective:  Ht 5\' 3"  (1.6 m)   Wt 172 lb (78 kg)   Breastfeeding No   BMI 30.47 kg/m    General:  alert, cooperative, and no distress   Breasts:  not indicated  Lungs: clear to auscultation bilaterally  Heart:  regular rate and rhythm, S1, S2 normal, no murmur, click, rub or gallop  Abdomen: soft, non-tender; bowel sounds normal; no masses,  no organomegaly   Wound N/a  GU exam:  not indicated       Assessment:     1. Postpartum care and examination   2. Postpartum depression   3. Encounter for initial prescription of injectable contraceptive   4. CIN III (cervical intraepithelial neoplasia grade III) with severe dysplasia      Plan:   Essential components of care per ACOG recommendations:  1.  Mood and well being: Patient with positive depression screening today. Reviewed local resources for support. Would like to meet with . Will refer. No medications desired at this time. Feels like things are improving. - Patient tobacco use? No.   - hx of drug use? No.    2. Infant care and feeding:  -Patient currently breastmilk feeding? No.  -Social determinants of health (SDOH) reviewed in EPIC. No  concerns  3. Sexuality, contraception and birth spacing - Patient does not want a pregnancy in the next year.  - Reviewed forms of contraception in tiered fashion. Patient desired Depo-Provera today.   - Discussed birth spacing of 18 months  4. Sleep and fatigue -Encouraged family/partner/community support of 4 hrs of uninterrupted sleep to help with mood and fatigue  5. Physical Recovery  - Discussed patients delivery and complications. She describes her labor as good. - Patient had a Vaginal, no problems at delivery. Patient had a 1st degree laceration. Perineal healing reviewed. Patient expressed understanding - Patient has urinary incontinence? No. - Patient is safe to resume physical and sexual  activity after 2 weeks  6.  Health Maintenance - HM due items addressed Yes - Last pap smear No results found for: DIAGPAP Pap smear not done at today's visit.  -Breast Cancer screening indicated? No.   7. Chronic Disease/Pregnancy Condition follow up: None Will schedule for colposcopy in 3-4 weeks.  - PCP follow up  Levie Heritage, DO [facility] [employer], [facility]

## 2021-04-29 ENCOUNTER — Telehealth: Payer: Self-pay | Admitting: Clinical

## 2021-04-29 NOTE — Telephone Encounter (Signed)
Attempt to schedule pt, per referral; Left HIPPA-compliant message to call back Asher Muir from [employer] for [employer] at [facility] at  [phone] [facility] office); Left MyChart message for pt.

## 2021-06-09 ENCOUNTER — Encounter: Payer: Self-pay | Admitting: Family Medicine

## 2021-06-09 ENCOUNTER — Ambulatory Visit (INDEPENDENT_AMBULATORY_CARE_PROVIDER_SITE_OTHER): Payer: Medicaid Other | Admitting: Family Medicine

## 2021-06-09 ENCOUNTER — Other Ambulatory Visit (HOSPITAL_COMMUNITY)
Admission: RE | Admit: 2021-06-09 | Discharge: 2021-06-09 | Disposition: A | Discharge status: Home / Self Care | Payer: Medicaid Other | Source: Ambulatory Visit | Attending: Family Medicine | Admitting: Family Medicine

## 2021-06-09 ENCOUNTER — Other Ambulatory Visit: Payer: Self-pay

## 2021-06-09 VITALS — BP 117/73 | HR 89 | Wt 173.0 lb

## 2021-06-09 DIAGNOSIS — Z01812 Encounter for preprocedural laboratory examination: Secondary | ICD-10-CM | POA: Diagnosis not present

## 2021-06-09 DIAGNOSIS — D069 Carcinoma in situ of cervix, unspecified: Secondary | ICD-10-CM

## 2021-06-09 LAB — POCT URINE PREGNANCY: Preg Test, Ur: NEGATIVE

## 2021-06-09 NOTE — Progress Notes (Signed)
Patient Name: Maria Davis, female   DOB: 03/04/1991, 30 y.o.  MRN: [id number]  Colposcopy Procedure Note:  G2P2002 Pregnancy status: Unknown No results found for: DIAGPAP, HPV, HPVHIGH  Cervical History: Previous Abnormal Pap: HSIL Previous Colposcopy: none Previous LEEP or Cryo:   Smoking: Former Smoker Hysterectomy: No Other History:   Patient given informed consent, signed copy in the chart, time out was performed.    Exam: Vulva and Vagina grossly normal.  Cervix viewed with speculum and colposcope after application of acetic acid:  Cervix Fully Visualized Squamocolumnar Junction Visibility: Fully visualized  Acetowhite lesions: 3, 9, and 12 o'clock  Other Lesions: None Punctation: Fine  Mosaicism: Not present Abnormal vasculature: No   Biopsies: 3, 9, and 12 o'clock ECC: Yes - Curette and Brush  Hemostasis achieved with:  Monsel's Solution  Colposcopy Impression:  CIN2-3   Patient was given post procedure instructions.  Will call patient with results.

## 2021-06-14 LAB — SURGICAL PATHOLOGY

## 2021-06-21 ENCOUNTER — Telehealth: Payer: Self-pay

## 2021-06-21 NOTE — Telephone Encounter (Signed)
-----   Message from Marti Sleigh, [place] sent at 06/21/2021 11:41 AM EST ----- Regarding: LEEP Call patient to cancel LEEP ----- Message ----- From: Levie Heritage, DO Sent: 06/17/2021  12:30 PM EST To: Marti Sleigh, NT  Needs annual PAP in a year

## 2021-06-21 NOTE — Telephone Encounter (Signed)
Patient called and made aware that colposcopy results showed low grade changes and that it can be followed by repeating her pap smear in one year. Patient made aware that we cancelled her Leep appointment as it is not needed.  Patient states understanding and agrees with plan. Armandina Stammer RN

## 2021-07-06 ENCOUNTER — Ambulatory Visit: Payer: Medicaid Other | Admitting: Family Medicine

## 2021-07-21 ENCOUNTER — Ambulatory Visit: Payer: Medicaid Other

## 2022-03-28 ENCOUNTER — Telehealth: Payer: Self-pay | Admitting: General Practice

## 2022-03-28 NOTE — Telephone Encounter (Signed)
Left message on VM asking pt to contact our office to schedule annual with repeat pap after 06/09/2021.  Per Dr. Nehemiah Settle, pt will need to repeat pap smear 1 year from last Colpo.

## 2022-06-14 ENCOUNTER — Ambulatory Visit: Payer: Medicaid Other | Admitting: Family Medicine

## 2022-07-20 ENCOUNTER — Ambulatory Visit: Payer: Medicaid Other | Admitting: Family Medicine

## 2022-07-20 ENCOUNTER — Other Ambulatory Visit (HOSPITAL_COMMUNITY)
Admission: RE | Admit: 2022-07-20 | Discharge: 2022-07-20 | Disposition: A | Discharge status: Home / Self Care | Payer: Medicaid Other | Source: Ambulatory Visit | Attending: Family Medicine | Admitting: Family Medicine

## 2022-07-20 ENCOUNTER — Encounter: Payer: Self-pay | Admitting: Family Medicine

## 2022-07-20 VITALS — BP 128/80 | HR 75 | Ht 63.0 in | Wt 163.0 lb

## 2022-07-20 DIAGNOSIS — Z30013 Encounter for initial prescription of injectable contraceptive: Secondary | ICD-10-CM

## 2022-07-20 DIAGNOSIS — Z01419 Encounter for gynecological examination (general) (routine) without abnormal findings: Secondary | ICD-10-CM

## 2022-07-20 DIAGNOSIS — D069 Carcinoma in situ of cervix, unspecified: Secondary | ICD-10-CM

## 2022-07-20 MED ORDER — MEDROXYPROGESTERONE ACETATE 150 MG/ML IM SUSP
150.0000 mg | Freq: Once | INTRAMUSCULAR | Status: AC
  Administered 2022-07-20: 150 mg via INTRAMUSCULAR

## 2022-07-20 MED ORDER — MEDROXYPROGESTERONE ACETATE 150 MG/ML IM SUSP: 150.0000 mg | INTRAMUSCULAR | 3 refills | Status: DC

## 2022-07-20 NOTE — Progress Notes (Signed)
ANNUAL EXAM Patient name: Maria Davis MRN [id number]  Date of birth: May 27, 1991 Chief Complaint:   Annual Exam  History of Present Illness:   Maria Davis is a 32 y.o.  [phone]  female  being seen today for a routine annual exam.  Current complaints: none. Wants to be on depo.  No LMP recorded. Patient has had an injection.   Upstream - 07/20/22 1502       Pregnancy Intention Screening   Does the patient want to become pregnant in the next year? No    Does the patient's partner want to become pregnant in the next year? No    Would the patient like to discuss contraceptive options today? Yes      Contraception Wrap Up   End Method Hormonal Injection    Contraception Counseling Provided Yes    How was the end contraceptive method provided? Provided on site             Last pap . Results were: HSIL w/ HRHPV positive: type not specified. Colpo 2022 CIN1 Last mammogram: n/a.     02/24/2021   10:10 AM 12/31/2020    8:48 AM  Depression screen PHQ 2/9  Decreased Interest 1 1  Down, Depressed, Hopeless 0 0  PHQ - 2 Score 1 1  Altered sleeping 0 1  Tired, decreased energy 1 1  Change in appetite 0 0  Feeling bad or failure about yourself  0 0  Trouble concentrating 0 0  Moving slowly or fidgety/restless 0 0  Suicidal thoughts 0 0  PHQ-9 Score 2 3        02/24/2021   10:11 AM 12/31/2020    8:48 AM  GAD 7 : Generalized Anxiety Score  Nervous, Anxious, on Edge 0 0  Control/stop worrying 0 1  Worry too much - different things 0 0  Trouble relaxing 0 0  Restless 0 0  Easily annoyed or irritable 0 0  Afraid - awful might happen 0 1  Total GAD 7 Score 0 2     Review of Systems:   Pertinent items are noted in HPI Denies any headaches, blurred vision, fatigue, shortness of breath, chest pain, abdominal pain, abnormal vaginal discharge/itching/odor/irritation, problems with periods, bowel movements, urination, or intercourse unless otherwise stated above. Pertinent  History Reviewed:  Reviewed past medical,surgical, social and family history.  Reviewed problem list, medications and allergies. Physical Assessment:   Vitals:   07/20/22 1455  BP: 128/80  Pulse: 75  Weight: 163 lb (73.9 kg)  Height: 5\' 3"  (1.6 m)  Body mass index is 28.87 kg/m.        Physical Examination:   General appearance - well appearing, and in no distress  Mental status - alert, oriented to person, place, and time  Psych:  She has a normal mood and affect  Skin - warm and dry, normal color, no suspicious lesions noted  Chest - effort normal, all lung fields clear to auscultation bilaterally  Heart - normal rate and regular rhythm  Neck:  midline trachea, no thyromegaly or nodules  Breasts - declined  Abdomen - soft, nontender, nondistended, no masses or organomegaly  Pelvic - VULVA: normal appearing vulva with no masses, tenderness or lesions  VAGINA: normal appearing vagina with normal color and discharge, no lesions  CERVIX: normal appearing cervix without discharge or lesions, no CMT  Thin prep pap is done with  HR HPV cotesting  UTERUS: uterus is felt to be normal size, shape, consistency  and nontender   ADNEXA: No adnexal masses or tenderness noted.  Extremities:  No swelling or varicosities noted  Chaperone present for exam  Assessment & Plan:  1. Well woman exam with routine gynecological exam - Cytology - PAP( [facility])   Labs/procedures today: n/a  No orders of the defined types were placed in this encounter.   Meds:  Meds ordered this encounter  Medications   medroxyPROGESTERone (DEPO-PROVERA) injection 150 mg   medroxyPROGESTERone (DEPO-PROVERA) 150 MG/ML injection    Sig: Inject 1 mL (150 mg total) into the muscle every 3 (three) months.    Dispense:  1 mL    Refill:  3    Follow-up: No follow-ups on file.  Truett Mainland, DO 07/20/2022 3:26 PM

## 2022-07-28 LAB — CYTOLOGY - PAP
Chlamydia: NEGATIVE
Comment: NEGATIVE
Comment: NEGATIVE
Comment: NORMAL
Diagnosis: NEGATIVE
Diagnosis: REACTIVE
High risk HPV: NEGATIVE
Neisseria Gonorrhea: NEGATIVE

## 2022-08-15 IMAGING — US US MFM OB FOLLOW-UP
1 series · 14 of 28 positions shown · non-contrast
Comparison: none

[Series 1: us mfm ob follow-up · 40 acquisitions, 14 frames shown]
[im 2/40]
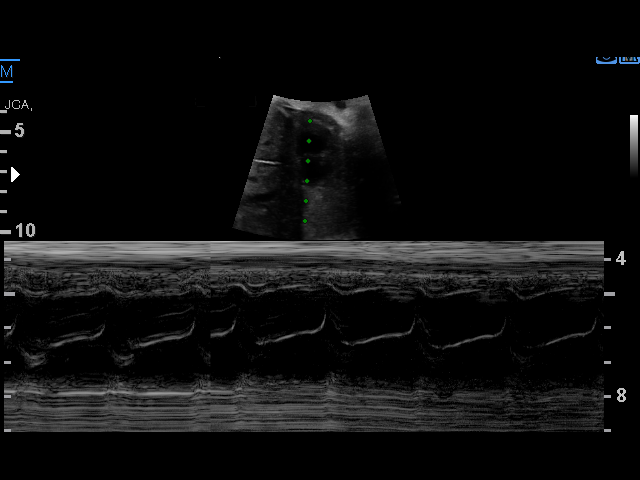
[im 5/40]
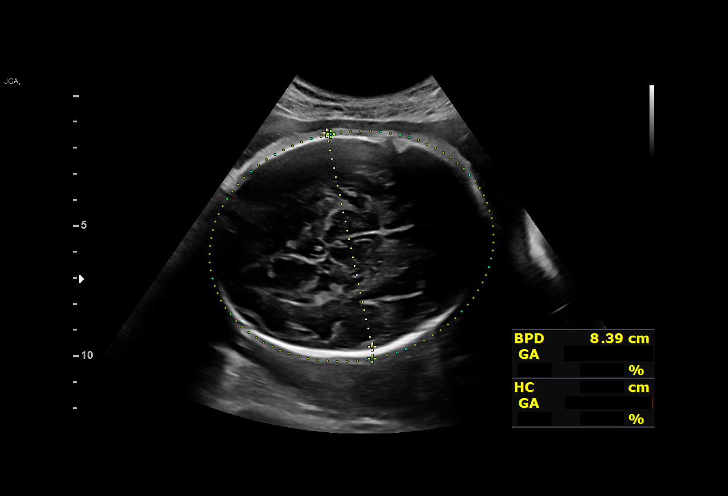
[im 8/40]
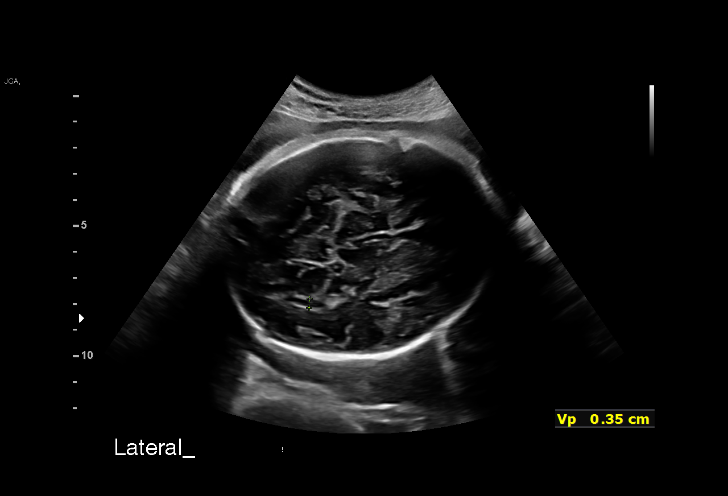
[im 11/40]
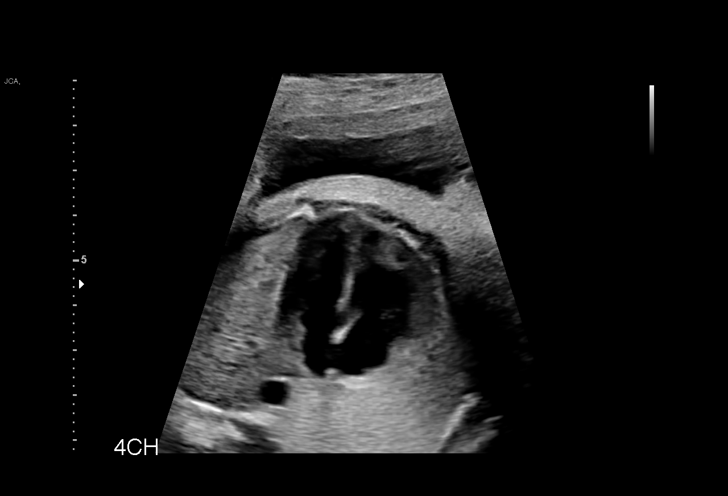
[im 14/40]
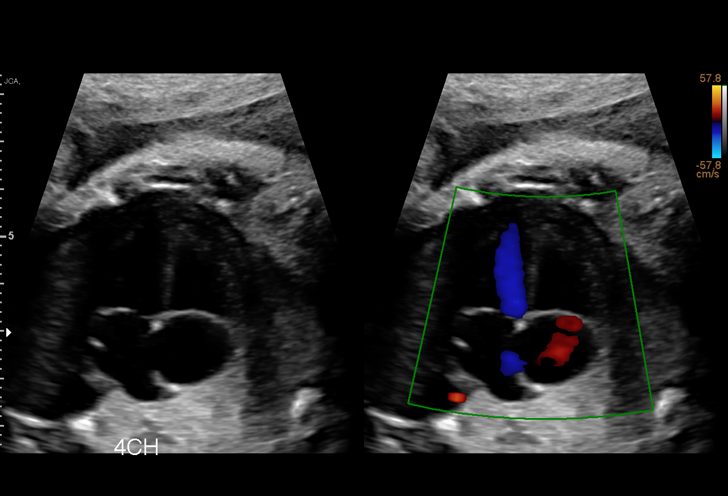
[im 16/40]
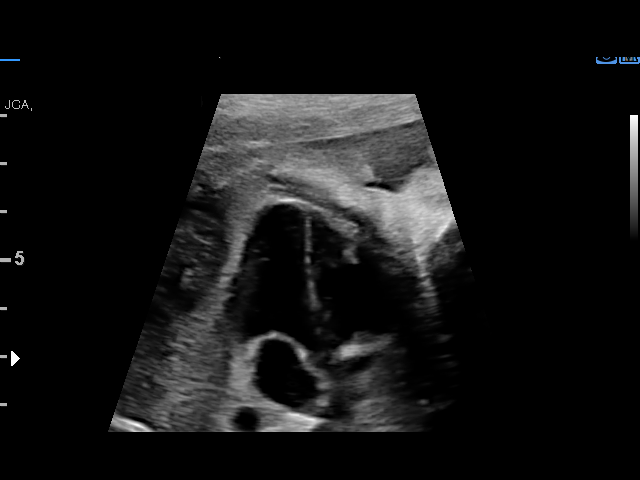
[im 19/40]
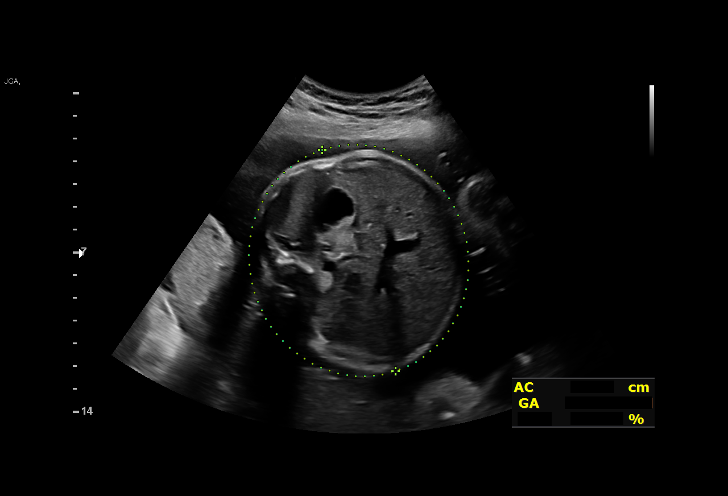
[im 22/40]
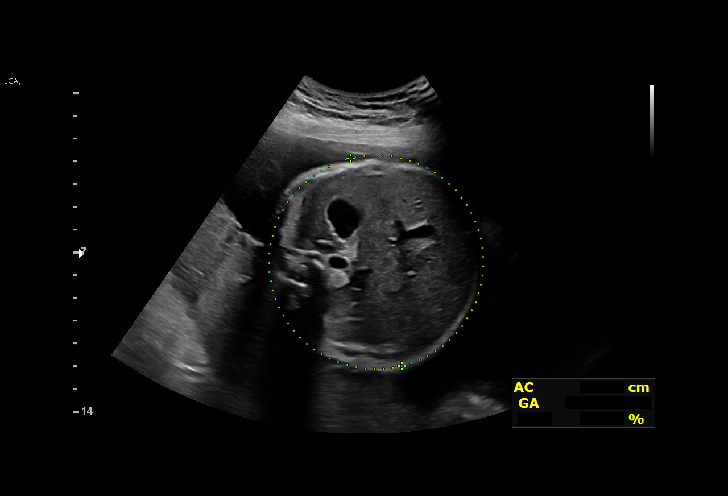
[im 25/40]
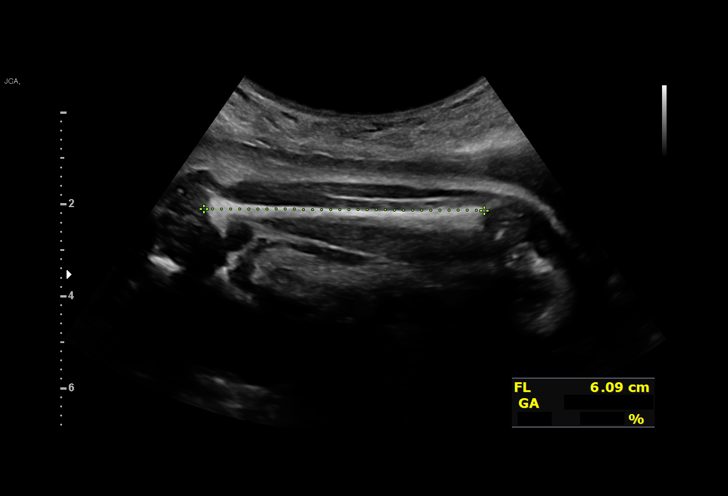
[im 28/40]
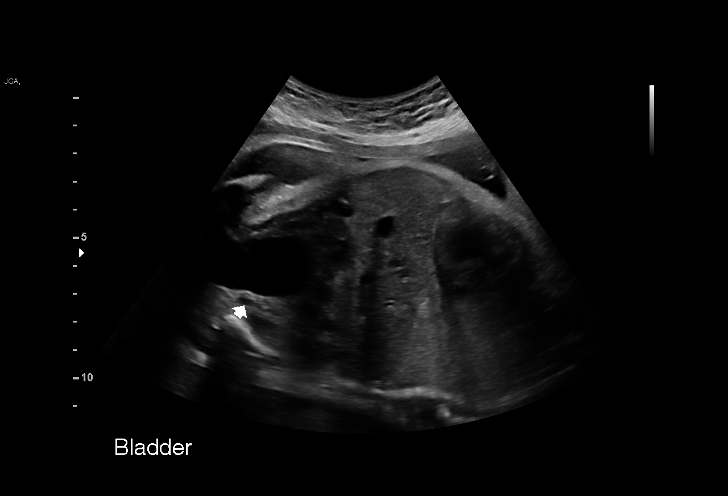
[im 31/40]
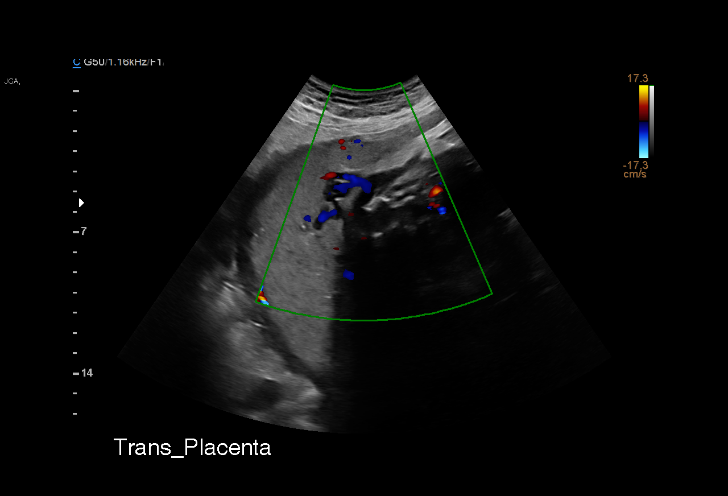
[im 34/40]
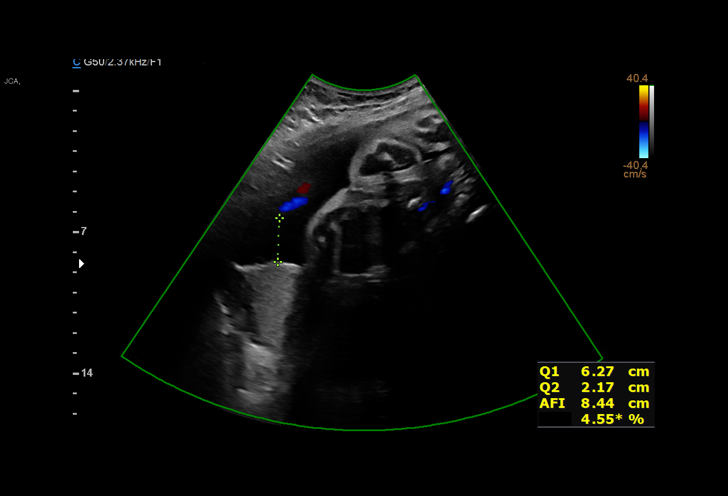
[im 37/40]
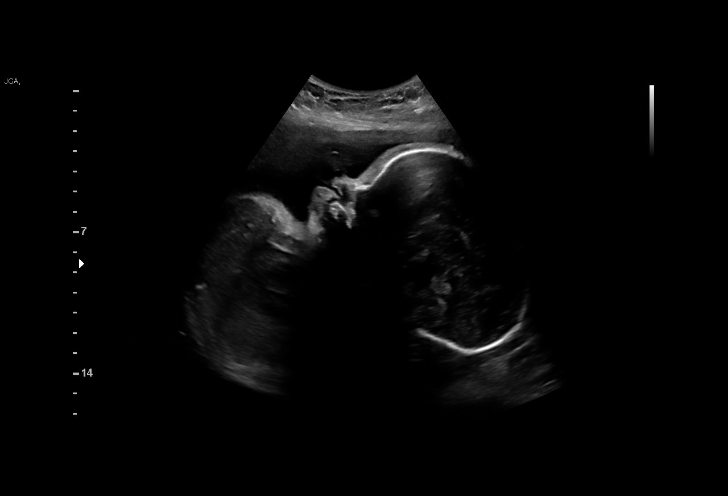
[im 40/40]
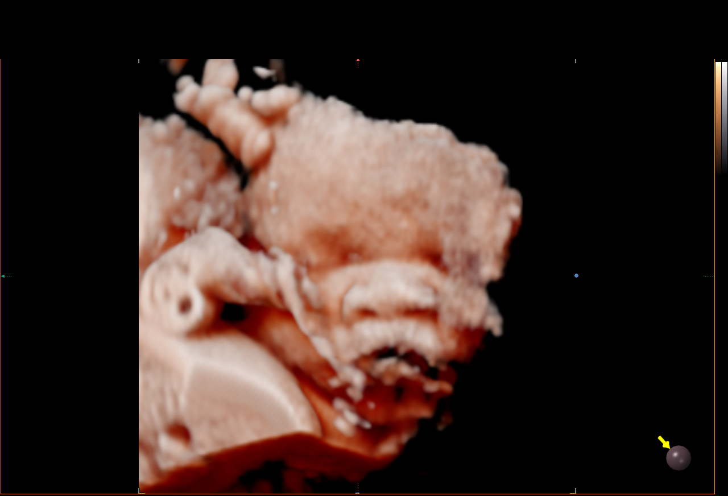

[14 of 28 positions shown; findings below may reference images not displayed]

DIAWARA

Indications

 Obesity complicating pregnancy, second
 trimester BMI 32
 Previous cervical surgery -LEEP
 Genetic carrier - Rushi Kutz
 Encounter for other antenatal screening
 follow-up
 32 weeks gestation of pregnancy
 LR NIPS Neg AFP
Fetal Evaluation

 Num Of Fetuses:         1
 Fetal Heart Rate(bpm):  157
 Cardiac Activity:       Observed
 Presentation:           Cephalic
 Placenta:               Anterior
 P. Cord Insertion:      Visualized

 Amniotic Fluid
 AFI FV:      Within normal limits

 AFI Sum(cm)     %Tile       Largest Pocket(cm)
 21.3            82

 RUQ(cm)       RLQ(cm)       LUQ(cm)        LLQ(cm)

Biometry

 BPD:      85.5  mm     G. Age:  34w 3d         96  %    CI:        74.55   %    70 - 86
                                                         FL/HC:      19.5   %    19.1 -
 HC:      314.3  mm     G. Age:  35w 2d         91  %    HC/AC:      1.06        0.96 -
 AC:      295.4  mm     G. Age:  33w 4d         87  %    FL/BPD:     71.6   %    71 - 87
 FL:       61.2  mm     G. Age:  31w 5d         31  %    FL/AC:      20.7   %    20 - 24
 LV:        3.5  mm

 Est. FW:    2828  gm    4 lb 12 oz      80  %
OB History

 Gravidity:    2
 Living:       1
Gestational Age

 LMP:           32w 0d        Date:  06/14/20                 EDD:   03/21/21
 U/S Today:     33w 5d                                        EDD:   03/09/21
 Best:          32w 0d     Det. By:  LMP  (06/14/20)          EDD:   03/21/21
Anatomy

 Cranium:               Appears normal         Aortic Arch:            Previously seen
 Cavum:                 Previously seen        Ductal Arch:            Previously seen
 Ventricles:            Appears normal         Diaphragm:              Appears normal
 Choroid Plexus:        Previously seen        Stomach:                Appears normal, left
                                                                       sided
 Cerebellum:            Previously seen        Abdomen:                Previously seen
 Posterior Fossa:       Previously seen        Abdominal Wall:         Previously seen
 Nuchal Fold:           Not applicable (>20    Cord Vessels:           Previously seen
                        wks GA)
 Face:                  Orbits and profile     Kidneys:                Appear normal
                        previously seen
 Lips:                  Previously seen        Bladder:                Appears normal
 Thoracic:              Appears normal         Spine:                  Previously seen
 Heart:                 Appears normal         Upper Extremities:      Previously seen
                        (4CH, axis, and
                        situs)
 RVOT:                  Previously seen        Lower Extremities:      Previously seen
 LVOT:                  Appears normal

 Other:  Male gender previously seen. Heels and 5th digit prev visualized. VC,
         3VV and 3VTV prev visualized.
Impression

 Maternal obesity.  Patient return for fetal growth assessment.
 She does not have gestational diabetes.  Blood pressure
 today at her office is 112/57 mmHg.

 Fetal growth is appropriate for gestational age .Amniotic fluid
 is normal and good fetal activity is seen .

 We reassured the patient of the findings.
Recommendations
 Follow-up scans as clinically indicated.
                 Choco, [place]

## 2022-10-09 ENCOUNTER — Telehealth: Payer: Self-pay

## 2022-10-09 ENCOUNTER — Ambulatory Visit (INDEPENDENT_AMBULATORY_CARE_PROVIDER_SITE_OTHER): Payer: Medicaid Other

## 2022-10-09 VITALS — BP 117/69 | HR 79

## 2022-10-09 DIAGNOSIS — Z3042 Encounter for surveillance of injectable contraceptive: Secondary | ICD-10-CM

## 2022-10-09 MED ORDER — MEDROXYPROGESTERONE ACETATE 150 MG/ML IM SUSY
150.0000 mg | PREFILLED_SYRINGE | Freq: Once | INTRAMUSCULAR | Status: AC
  Administered 2022-10-09: 150 mg via INTRAMUSCULAR

## 2022-10-09 NOTE — Progress Notes (Signed)
Date last pap: 07/20/2022. Last Depo-Provera: 07/20/2022. Side Effects if any: some spotting. Serum HCG indicated? none. Depo-Provera 150 mg IM given by: Burnard Leigh RN. Next appointment due in 3months.

## 2022-10-09 NOTE — Telephone Encounter (Signed)
Left voicemail for patient to call back and schedule her next Depo shot.

## 2023-01-26 ENCOUNTER — Ambulatory Visit: Payer: Medicaid Other

## 2023-01-31 ENCOUNTER — Ambulatory Visit (INDEPENDENT_AMBULATORY_CARE_PROVIDER_SITE_OTHER): Payer: Medicaid Other

## 2023-01-31 VITALS — BP 122/71 | HR 91 | Wt 152.0 lb

## 2023-01-31 DIAGNOSIS — Z3202 Encounter for pregnancy test, result negative: Secondary | ICD-10-CM

## 2023-01-31 LAB — POCT URINE PREGNANCY: Preg Test, Ur: NEGATIVE

## 2023-01-31 NOTE — Progress Notes (Signed)
Patient presents for UPT. UPT negative. Patient will repeat UPT in 2 weeks to restart Depo. Advised patient to not have unprotected sex for 2 weeks.  l , CMA

## 2023-02-14 ENCOUNTER — Ambulatory Visit: Payer: Medicaid Other

## 2023-02-15 ENCOUNTER — Ambulatory Visit: Payer: Medicaid Other

## 2023-02-15 VITALS — BP 109/64 | HR 74 | Wt 152.0 lb

## 2023-02-15 DIAGNOSIS — Z3042 Encounter for surveillance of injectable contraceptive: Secondary | ICD-10-CM | POA: Diagnosis not present

## 2023-02-15 MED ORDER — MEDROXYPROGESTERONE ACETATE 150 MG/ML IM SUSP
150.0000 mg | Freq: Once | INTRAMUSCULAR | Status: AC
  Administered 2023-02-15: 150 mg via INTRAMUSCULAR

## 2023-02-15 NOTE — Progress Notes (Signed)
Maria Davis here for Depo-Provera  Injection.  Injection administered without complication. Patient will return in 3 months for next injection.  Staphany Ditton l Tyreonna Czaplicki, CMA 02/15/2023  8:03 AM

## 2023-05-03 ENCOUNTER — Ambulatory Visit: Payer: Medicaid Other

## 2023-05-15 ENCOUNTER — Ambulatory Visit: Payer: Medicaid Other

## 2023-05-15 NOTE — Progress Notes (Deleted)
Date last pap: 07/20/2022 Last Depo-Provera: 02/15/2023 Side Effects if any: ***. Serum HCG indicated? N/A Depo-Provera 150 mg IM given by: ***. Next appointment due: Feb 11th - Feb 25th

## 2023-05-16 ENCOUNTER — Ambulatory Visit (INDEPENDENT_AMBULATORY_CARE_PROVIDER_SITE_OTHER): Payer: Medicaid Other

## 2023-05-16 VITALS — BP 121/79 | HR 90

## 2023-05-16 DIAGNOSIS — Z3042 Encounter for surveillance of injectable contraceptive: Secondary | ICD-10-CM | POA: Diagnosis not present

## 2023-05-16 MED ORDER — MEDROXYPROGESTERONE ACETATE 150 MG/ML IM SUSY
150.0000 mg | PREFILLED_SYRINGE | Freq: Once | INTRAMUSCULAR | Status: AC
  Administered 2023-05-16: 150 mg via INTRAMUSCULAR

## 2023-05-16 NOTE — Progress Notes (Signed)
Date last pap: 07/20/2022 Last Depo-Provera: 02/15/2023 Side Effects if any: None Serum HCG indicated? N/A Depo-Provera 150 mg IM given by: L. Ernest Mallick, RN Next appointment due: Feb 11th - Feb 25th

## 2023-08-02 ENCOUNTER — Emergency Department (HOSPITAL_BASED_OUTPATIENT_CLINIC_OR_DEPARTMENT_OTHER): Payer: No Typology Code available for payment source

## 2023-08-02 ENCOUNTER — Other Ambulatory Visit: Payer: Self-pay

## 2023-08-02 ENCOUNTER — Emergency Department (HOSPITAL_BASED_OUTPATIENT_CLINIC_OR_DEPARTMENT_OTHER)
Admission: EM | Admit: 2023-08-02 | Discharge: 2023-08-02 | Disposition: A | Discharge status: Home / Self Care | Payer: No Typology Code available for payment source | Attending: Emergency Medicine | Admitting: Emergency Medicine

## 2023-08-02 DIAGNOSIS — M79641 Pain in right hand: Secondary | ICD-10-CM | POA: Diagnosis present

## 2023-08-02 DIAGNOSIS — R0781 Pleurodynia: Secondary | ICD-10-CM | POA: Diagnosis not present

## 2023-08-02 DIAGNOSIS — Y92410 Unspecified street and highway as the place of occurrence of the external cause: Secondary | ICD-10-CM | POA: Diagnosis not present

## 2023-08-02 MED ORDER — KETOROLAC TROMETHAMINE 15 MG/ML IJ SOLN
15.0000 mg | Freq: Once | INTRAMUSCULAR | Status: AC
  Administered 2023-08-02: 15 mg via INTRAMUSCULAR
  Filled 2023-08-02: qty 1

## 2023-08-02 NOTE — ED Notes (Signed)
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
## 2023-08-02 NOTE — Discharge Instructions (Signed)
Please take 600 mg of ibuprofen every 6 hours as needed for pain.  As we discussed, there is no evidence of fractures on your imaging today which is great news.  Follow-up with your primary care doctor.  You may return to the emergency department for any worsening symptoms.
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
## 2023-08-06 ENCOUNTER — Ambulatory Visit: Payer: Self-pay

## 2023-08-08 ENCOUNTER — Ambulatory Visit: Payer: Medicaid Other

## 2023-08-13 ENCOUNTER — Ambulatory Visit: Payer: Medicaid Other

## 2024-03-13 ENCOUNTER — Other Ambulatory Visit: Payer: Self-pay | Admitting: Orthopedic Surgery

## 2024-03-13 DIAGNOSIS — M25531 Pain in right wrist: Secondary | ICD-10-CM

## 2024-04-30 ENCOUNTER — Other Ambulatory Visit: Payer: Self-pay | Admitting: Family Medicine

## 2024-05-01 ENCOUNTER — Ambulatory Visit: Admitting: Family Medicine

## 2024-06-04 ENCOUNTER — Ambulatory Visit: Admitting: Obstetrics and Gynecology

## 2024-06-04 ENCOUNTER — Other Ambulatory Visit (HOSPITAL_COMMUNITY)
Admission: RE | Admit: 2024-06-04 | Discharge: 2024-06-04 | Disposition: A | Discharge status: Home / Self Care | Source: Ambulatory Visit | Attending: Obstetrics and Gynecology | Admitting: Obstetrics and Gynecology

## 2024-06-04 VITALS — BP 122/70 | HR 72 | Ht 63.0 in | Wt 156.0 lb

## 2024-06-04 DIAGNOSIS — Z3202 Encounter for pregnancy test, result negative: Secondary | ICD-10-CM | POA: Diagnosis not present

## 2024-06-04 DIAGNOSIS — Z3200 Encounter for pregnancy test, result unknown: Secondary | ICD-10-CM

## 2024-06-04 DIAGNOSIS — Z01419 Encounter for gynecological examination (general) (routine) without abnormal findings: Secondary | ICD-10-CM | POA: Insufficient documentation

## 2024-06-04 DIAGNOSIS — Z8742 Personal history of other diseases of the female genital tract: Secondary | ICD-10-CM | POA: Insufficient documentation

## 2024-06-04 DIAGNOSIS — Z3042 Encounter for surveillance of injectable contraceptive: Secondary | ICD-10-CM

## 2024-06-04 LAB — POCT URINE PREGNANCY: Preg Test, Ur: NEGATIVE

## 2024-06-04 MED ORDER — MEDROXYPROGESTERONE ACETATE 150 MG/ML IM SUSP
150.0000 mg | Freq: Once | INTRAMUSCULAR | Status: AC
  Administered 2024-06-04: 11:00:00 150 mg via INTRAMUSCULAR

## 2024-06-04 NOTE — Progress Notes (Signed)
 Patient presents for Annual.  LMP: Patient's last menstrual period was 05/23/2024 (exact date).  Last pap: 07/20/2022 Contraception: Depo Mammogram: Not yet indicated STD Screening: Declines Flu Vaccine : Declines  CC:   Fun Fact:   GAD 7 AND PHQ9 DONE

## 2024-06-04 NOTE — Progress Notes (Signed)
 ANNUAL GYNECOLOGY VISIT Chief Complaint  Patient presents with   Gynecologic Exam    Annual and restart depo      Subjective:  Maria Davis is a 33 y.o. [id number] who presents for annual exam.  No concerns  Gyn History: Patient's last menstrual period was 05/23/2024 (exact date). Sexually active: yes/no: Yes Contraception: no method Last pap:  Lab Results  Component Value Date   DIAGPAP  07/20/2022    - Negative for Intraepithelial Lesions or Malignancy (NILM)   DIAGPAP - Benign reactive/reparative changes 07/20/2022   DIAGPAP - See comment 07/20/2022   HPVHIGH Negative 07/20/2022   History of abnormal pap: Yes:   07/2022: NILM/HPV neg 05/2021 Postpartum Colpo CIN1, ECC neg Pregnancy End of 2021: LEEP in OR, pathology not available 01/2020 Unable to tolerate office LEEP 09/2019 Colpo CIN1, CIN2-3 08/2019 ASCUS, HRHPV+ (other)  Periods: regular        06/04/2024   10:31 AM 02/24/2021   10:10 AM 12/31/2020    8:48 AM  Depression screen PHQ 2/9  Decreased Interest 0 1 1  Down, Depressed, Hopeless 0 0 0  PHQ - 2 Score 0 1 1  Altered sleeping 0 0 1  Tired, decreased energy 0 1 1  Change in appetite 0 0 0  Feeling bad or failure about yourself  0 0 0  Trouble concentrating 0 0 0  Moving slowly or fidgety/restless 0 0 0  Suicidal thoughts 0 0 0  PHQ-9 Score 0 2  3      Data saved with a previous flowsheet row definition        06/04/2024   10:31 AM 02/24/2021   10:11 AM 12/31/2020    8:48 AM  GAD 7 : Generalized Anxiety Score  Nervous, Anxious, on Edge 0 0 0  Control/stop worrying 0 0 1  Worry too much - different things 0 0 0  Trouble relaxing 0 0 0  Restless 0 0 0  Easily annoyed or irritable 0 0 0  Afraid - awful might happen 0 0 1  Total GAD 7 Score 0 0 2      OB History     Gravida  2   Para  2   Term  2   Preterm      AB      Living  2      SAB      IAB      Ectopic      Multiple  0   Live Births  2           Past  Medical History:  Diagnosis Date   Vaginal Pap smear, abnormal     Past Surgical History:  Procedure Laterality Date   WISDOM TOOTH EXTRACTION  2019    Social History   Socioeconomic History   Marital status: Single    Spouse name: Not on file   Number of children: Not on file   Years of education: Not on file   Highest education level: Not on file  Occupational History   Not on file  Tobacco Use   Smoking status: Former    Current packs/day: 0.00    Average packs/day: 0.5 packs/day    Types: Cigarettes    Quit date: 06/19/2020    Years since quitting: 3.9   Smokeless tobacco: Never  Vaping Use   Vaping status: Never Used  Substance and Sexual Activity   Alcohol use: Yes    Comment: occasional   Drug  use: No   Sexual activity: Yes    Birth control/protection: None  Other Topics Concern   Not on file  Social History Narrative   Not on file   Social Drivers of Health   Tobacco Use: Low Risk  (05/09/2024)   Received from [facility]   Patient History    Smoking Tobacco Use: Never    Smokeless Tobacco Use: Never    Passive Exposure: Not on file  Financial Resource Strain: Low Risk (08/13/2023)   Received from [facility]   Overall Financial Resource Strain (CARDIA)    Difficulty of Paying Living Expenses: Not hard at all  Food Insecurity: No Food Insecurity (08/13/2023)   Received from [facility]   Epic    Within the past 12 months, you worried that your food would run out before you got the money to buy more.: Never true    Within the past 12 months, the food you bought just didn't last and you didn't have money to get more.: Never true  Transportation Needs: No Transportation Needs (08/13/2023)   Received from [facility] - Transportation    Lack of Transportation (Medical): No    Lack of Transportation (Non-Medical): No  Physical Activity: Not on file  Stress: Not on file  Social Connections: Unknown (02/27/2023)   Received from [facility]    Social Network    Social Network: Not on file  Depression (PHQ2-9): Low Risk (06/04/2024)   Depression (PHQ2-9)    PHQ-2 Score: 0  Alcohol Screen: Not on file  Housing: Low Risk (08/13/2023)   Received from [facility]   Epic    At any time in the past 12 months, were you homeless or living in a shelter (including now)?: No    In the last 12 months, was there a time when you were not able to pay the mortgage or rent on time?: No    In the past 12 months, how many times have you moved where you were living?: 0  Utilities: Not At Risk (08/13/2023)   Received from [facility] Utilities    Threatened with loss of utilities: No  Health Literacy: Not on file    Family History  Problem Relation Age of Onset   Hypertension Sister    Asthma Son    Hypertension Maternal Grandmother     Medications Ordered Prior to Encounter[1]  Allergies[2]   Objective:   Vitals:   06/04/24 1027  BP: 122/70  Pulse: 72  Weight: 156 lb (70.8 kg)  Height: 5' 3 (1.6 m)   Physical Examination:   General appearance - well appearing, and in no distress  Mental status - alert, oriented to person, place, and time  Psych:  normal mood and affect  Skin - warm and dry, normal color, no suspicious lesions noted  Breasts - breasts appear normal, no suspicious masses, no skin or nipple changes or  axillary nodes  Abdomen - soft, nontender, nondistended, no masses or organomegaly  Pelvic -  VULVA: normal appearing vulva with no masses, tenderness or lesions   VAGINA: normal appearing vagina with normal color and discharge, no lesions   CERVIX: normal appearing cervix without discharge or lesions, no CMT  Thin prep pap is done with HR HPV cotesting  UTERUS: uterus is felt to be normal size, shape, consistency and nontender   ADNEXA: No adnexal masses or tenderness noted.  Extremities:  No swelling or varicosities noted  Chaperone present for exam  Assessment and Plan:  1. Well woman exam with  routine gynecological exam (Primary) Pap/HPV Normal clinical breast exam, reviewed self breast exams, mammograms to start at age 73 STI swab, declines bloodwork Start depo  2. History of abnormal cervical Pap smear - Cytology - PAP  3. Encounter for confirmation of pregnancy test result with physical examination UPT neg - POCT urine pregnancy  4. Encounter for Depo-Provera  contraception Denies unprotected intercourse since last period UPT neg Start depo Counseled on back up method x 1 week  Rollo ONEIDA Bring, MD, FACOG Obstetrician & Gynecologist, [facility] for [employer], [facility]     [1]  Current Outpatient Medications on File Prior to Visit  Medication Sig Dispense Refill   medroxyPROGESTERone  (DEPO-PROVERA ) 150 MG/ML injection ADMINISTER 1 ML(150 MG) IN THE MUSCLE EVERY 3 MONTHS 1 mL 3   Prenatal Vit-Fe Fumarate-FA (PRENATAL VITAMIN) 27-0.8 MG TABS Take 1 tablet daily. (Patient not taking: Reported on 01/31/2023) 30 tablet 11   No current facility-administered medications on file prior to visit.  [2]  Allergies Allergen Reactions   Tomato Itching and Nausea And Vomiting    Throat itching

## 2024-06-05 LAB — CERVICOVAGINAL ANCILLARY ONLY
Chlamydia: NEGATIVE
Comment: NEGATIVE
Comment: NEGATIVE
Comment: NORMAL
Neisseria Gonorrhea: NEGATIVE
Trichomonas: NEGATIVE

## 2024-06-06 ENCOUNTER — Ambulatory Visit: Payer: Self-pay | Admitting: Obstetrics and Gynecology

## 2024-06-06 DIAGNOSIS — B9689 Other specified bacterial agents as the cause of diseases classified elsewhere: Secondary | ICD-10-CM

## 2024-06-06 LAB — CYTOLOGY - PAP
Comment: NEGATIVE
Diagnosis: NEGATIVE
High risk HPV: NEGATIVE

## 2024-06-06 MED ORDER — METRONIDAZOLE 0.75 % VA GEL: 1.0000 | Freq: Every day | VAGINAL | 1 refills | Status: AC

## 2024-08-19 ENCOUNTER — Ambulatory Visit
